# Patient Record
Sex: Female | Born: 1985 | Race: Black or African American | Hispanic: No | Marital: Single | Smoking: Never smoker
Health system: Southern US, Community
[De-identification: ages and names within clinical notes are randomized; demographics above are authoritative.]

## PROBLEM LIST (undated history)

## (undated) DIAGNOSIS — Z9889 Other specified postprocedural states: Secondary | ICD-10-CM

## (undated) DIAGNOSIS — J9383 Other pneumothorax: Secondary | ICD-10-CM

## (undated) DIAGNOSIS — R011 Cardiac murmur, unspecified: Secondary | ICD-10-CM

## (undated) DIAGNOSIS — N809 Endometriosis, unspecified: Secondary | ICD-10-CM

## (undated) DIAGNOSIS — R0602 Shortness of breath: Secondary | ICD-10-CM

## (undated) DIAGNOSIS — D649 Anemia, unspecified: Secondary | ICD-10-CM

## (undated) DIAGNOSIS — R112 Nausea with vomiting, unspecified: Secondary | ICD-10-CM

## (undated) DIAGNOSIS — R51 Headache: Secondary | ICD-10-CM

## (undated) HISTORY — DX: Other pneumothorax: J93.83

---
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
## 2010-11-02 DIAGNOSIS — R0602 Shortness of breath: Secondary | ICD-10-CM

## 2010-11-02 HISTORY — DX: Shortness of breath: R06.02

## 2011-08-25 ENCOUNTER — Emergency Department (HOSPITAL_COMMUNITY): Payer: PRIVATE HEALTH INSURANCE

## 2011-08-25 ENCOUNTER — Inpatient Hospital Stay (HOSPITAL_COMMUNITY)
Admission: EM | Admit: 2011-08-25 | Discharge: 2011-08-27 | DRG: 201 | Disposition: A | Discharge status: Home / Self Care | Payer: PRIVATE HEALTH INSURANCE | Attending: Pulmonary Disease | Admitting: Pulmonary Disease

## 2011-08-25 DIAGNOSIS — D649 Anemia, unspecified: Secondary | ICD-10-CM

## 2011-08-25 DIAGNOSIS — R079 Chest pain, unspecified: Secondary | ICD-10-CM

## 2011-08-25 DIAGNOSIS — R0989 Other specified symptoms and signs involving the circulatory and respiratory systems: Secondary | ICD-10-CM | POA: Diagnosis present

## 2011-08-25 DIAGNOSIS — J9383 Other pneumothorax: Principal | ICD-10-CM | POA: Diagnosis present

## 2011-08-25 DIAGNOSIS — R0609 Other forms of dyspnea: Secondary | ICD-10-CM | POA: Diagnosis present

## 2011-08-25 DIAGNOSIS — J9311 Primary spontaneous pneumothorax: Secondary | ICD-10-CM

## 2011-08-25 LAB — DIFFERENTIAL
Basophils Absolute: 0 10*3/uL (ref 0.0–0.1)
Basophils Relative: 1 % (ref 0–1)
Eosinophils Absolute: 0.2 10*3/uL (ref 0.0–0.7)
Eosinophils Relative: 4 % (ref 0–5)
Lymphocytes Relative: 46 % (ref 12–46)
Lymphs Abs: 2.9 10*3/uL (ref 0.7–4.0)
Monocytes Absolute: 0.5 10*3/uL (ref 0.1–1.0)
Monocytes Relative: 8 % (ref 3–12)
Neutro Abs: 2.7 10*3/uL (ref 1.7–7.7)
Neutrophils Relative %: 42 % — ABNORMAL LOW (ref 43–77)

## 2011-08-25 LAB — CBC
HCT: 40.6 % (ref 36.0–46.0)
Hemoglobin: 13.7 g/dL (ref 12.0–15.0)
MCH: 26.6 pg (ref 26.0–34.0)
MCHC: 33.7 g/dL (ref 30.0–36.0)
MCV: 78.7 fL (ref 78.0–100.0)
Platelets: 322 10*3/uL (ref 150–400)
RBC: 5.16 MIL/uL — ABNORMAL HIGH (ref 3.87–5.11)
RDW: 13 % (ref 11.5–15.5)
WBC: 6.4 10*3/uL (ref 4.0–10.5)

## 2011-08-25 LAB — URINE MICROSCOPIC-ADD ON

## 2011-08-25 LAB — URINALYSIS, ROUTINE W REFLEX MICROSCOPIC
Bilirubin Urine: NEGATIVE
Glucose, UA: NEGATIVE mg/dL
Nitrite: NEGATIVE
Specific Gravity, Urine: 1.035 — ABNORMAL HIGH (ref 1.005–1.030)
Urobilinogen, UA: 1 mg/dL (ref 0.0–1.0)

## 2011-08-26 ENCOUNTER — Emergency Department (HOSPITAL_COMMUNITY): Payer: PRIVATE HEALTH INSURANCE

## 2011-08-26 ENCOUNTER — Inpatient Hospital Stay (HOSPITAL_COMMUNITY): Payer: PRIVATE HEALTH INSURANCE

## 2011-08-26 LAB — CBC
HCT: 36.8 % (ref 36.0–46.0)
Hemoglobin: 12.3 g/dL (ref 12.0–15.0)
MCH: 26.1 pg (ref 26.0–34.0)
Platelets: 321 10*3/uL (ref 150–400)
WBC: 6.1 10*3/uL (ref 4.0–10.5)

## 2011-08-26 LAB — COMPREHENSIVE METABOLIC PANEL
ALT: 16 U/L (ref 0–35)
AST: 21 U/L (ref 0–37)
Albumin: 4.1 g/dL (ref 3.5–5.2)
Alkaline Phosphatase: 36 U/L — ABNORMAL LOW (ref 39–117)
BUN: 8 mg/dL (ref 6–23)
CO2: 24 mEq/L (ref 19–32)
Calcium: 10.2 mg/dL (ref 8.4–10.5)
Chloride: 105 mEq/L (ref 96–112)
Creatinine, Ser: 0.88 mg/dL (ref 0.50–1.10)
GFR calc Af Amer: >90 mL/min (ref >90)
GFR calc non Af Amer: >90 mL/min (ref >90)
Glucose, Bld: 88 mg/dL (ref 70–99)
Potassium: 3.6 mEq/L (ref 3.5–5.1)
Sodium: 141 mEq/L (ref 135–145)
Total Bilirubin: 0.1 mg/dL — ABNORMAL LOW (ref 0.3–1.2)
Total Protein: 8.1 g/dL (ref 6.0–8.3)

## 2011-08-26 LAB — RAPID URINE DRUG SCREEN, HOSP PERFORMED
Amphetamines: NOT DETECTED
Benzodiazepines: NOT DETECTED
Opiates: NOT DETECTED

## 2011-08-26 LAB — DIFFERENTIAL
Basophils Absolute: 0 10*3/uL (ref 0.0–0.1)
Basophils Relative: 1 % (ref 0–1)
Lymphocytes Relative: 56 % — ABNORMAL HIGH (ref 12–46)
Neutrophils Relative %: 33 % — ABNORMAL LOW (ref 43–77)

## 2011-08-26 LAB — BASIC METABOLIC PANEL
BUN: 8 mg/dL (ref 6–23)
CO2: 24 mEq/L (ref 19–32)
Chloride: 104 mEq/L (ref 96–112)
Creatinine, Ser: 0.74 mg/dL (ref 0.50–1.10)
GFR calc Af Amer: >90 mL/min (ref >90)
GFR calc non Af Amer: >90 mL/min (ref >90)
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
## 2011-08-27 ENCOUNTER — Inpatient Hospital Stay (HOSPITAL_COMMUNITY): Payer: PRIVATE HEALTH INSURANCE

## 2011-08-27 ENCOUNTER — Encounter: Payer: Self-pay | Admitting: Internal Medicine

## 2011-08-27 DIAGNOSIS — D649 Anemia, unspecified: Secondary | ICD-10-CM

## 2011-08-27 DIAGNOSIS — J9311 Primary spontaneous pneumothorax: Secondary | ICD-10-CM

## 2011-08-27 DIAGNOSIS — R079 Chest pain, unspecified: Secondary | ICD-10-CM

## 2011-08-27 NOTE — Discharge Summary (Cosign Needed)
NAMEMarland Kitchen  CATERA, HANKINS    ACCOUNT NO.:  [id number]  MEDICAL RECORD NO.:  [id number]  LOCATION:  1540                         FACILITY:  [facility]  PHYSICIAN:  Charlaine Dalton. Wert, MD, FCCPDATE OF BIRTH:  29-May-1986  DATE OF ADMISSION:  08/25/2011 DATE OF DISCHARGE:  08/27/2011                              DISCHARGE SUMMARY   ADDENDUM  After the previous discharge summary was completed, nursing got the patient up to ambulate during which time, she became more short of breath and had increased chest pain with rest.  The patient improved, however, she did continue to have significant shortness of breath with any activity.  The patient's discharge was canceled at that time, and the patient was admitted for further inpatient observation x24 hours.  Today, August 27, 2011, the patient is much improved, has ambulated and showered without significant shortness of breath or chest pain, and a spontaneous right pneumothorax is slightly improved on this morning's chest x-ray.  The patient will be discharged per previous discharge summary and will keep her outpatient follow up with Dr. Sherene Sires tomorrow morning on August 28, 2011 at 8:45 a.m. with a chest x-ray for further monitoring of spontaneous right pneumothorax.  The patient has been instructed to call the office and/or return to the emergency room if she experiences any worsening shortness of breath or chest pain.  Please see previous discharge summary for discharge medications.     Dirk Dress, NP   ______________________________ Charlaine Dalton. Sherene Sires, MD, [facility]    KW/MEDQ  D:  08/27/2011  T:  08/27/2011  Job:  [id number]

## 2011-08-28 ENCOUNTER — Ambulatory Visit (INDEPENDENT_AMBULATORY_CARE_PROVIDER_SITE_OTHER)
Admission: RE | Admit: 2011-08-28 | Discharge: 2011-08-28 | Disposition: A | Discharge status: Home / Self Care | Payer: PRIVATE HEALTH INSURANCE | Source: Ambulatory Visit | Attending: Internal Medicine | Admitting: Internal Medicine

## 2011-08-28 ENCOUNTER — Encounter: Payer: Self-pay | Admitting: *Deleted

## 2011-08-28 ENCOUNTER — Encounter: Payer: Self-pay | Admitting: Internal Medicine

## 2011-08-28 ENCOUNTER — Ambulatory Visit (INDEPENDENT_AMBULATORY_CARE_PROVIDER_SITE_OTHER): Payer: PRIVATE HEALTH INSURANCE | Admitting: Internal Medicine

## 2011-08-28 VITALS — BP 112/60 | HR 84 | Temp 98.4°F | Ht 63.5 in | Wt 121.8 lb

## 2011-08-28 DIAGNOSIS — J9383 Other pneumothorax: Secondary | ICD-10-CM

## 2011-08-28 DIAGNOSIS — J939 Pneumothorax, unspecified: Secondary | ICD-10-CM

## 2011-08-28 NOTE — Progress Notes (Signed)
Subjective:     Patient ID: Claudia Snyder, female   DOB: [birth year], 25 y.o.   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  Minimal mucoid sputum.   Sleeping ok without nocturnal  or early am exacerbation  of respiratory  c/o's or need for noct saba. Also denies any obvious fluctuation of symptoms with weather or environmental changes or other aggravating or alleviating factors except as outlined above      Fm Hx Pneumothorax/ mother Asthma/ sister    Review of Systems     Objective:   Physical Exam    amb bf nad   Wt 121 08/28/2011  HEENT: nl dentition, turbinates, and orophanx. Nl external ear canals without cough reflex   NECK :  without JVD/Nodes/TM/ nl carotid upstrokes bilaterally   LUNGS: no acc muscle use, minimal decrease bs on R, no wheeze  CV:  RRR  no s3 or murmur or increase in P2, no edema   ABD:  soft and nontender with nl excursion in the supine position. No bruits or organomegaly, bowel sounds nl  MS:  warm without deformities, calf tenderness, cyanosis or clubbing  SKIN: warm and dry without lesions    NEURO:  alert, approp, no deficits   CXR  08/28/2011 :  Persistent right apical pneumothorax with no interval change in size since the previous exam.    Assessment:         Plan:

## 2011-08-28 NOTE — Progress Notes (Signed)
Quick Note:  Spoke with pt and notified of results per Dr. Wert. Pt verbalized understanding and denied any questions.  ______ 

## 2011-08-28 NOTE — Patient Instructions (Addendum)
Please remember to go to the x-ray department downstairs for your tests - we will call you with the results when then are available.   Please schedule a follow up office visit in 1 weeks, sooner if needed with cxr on return  > if pain or short of breath get a lot worse go to [place].

## 2011-08-28 NOTE — Progress Notes (Deleted)
dfse

## 2011-08-29 ENCOUNTER — Encounter: Payer: Self-pay | Admitting: Internal Medicine

## 2011-08-29 NOTE — Assessment & Plan Note (Signed)
Discussed etiology and management options  See instructions for specific recommendations which were reviewed directly with the patient who was given a copy with highlighter outlining the key components.

## 2011-09-03 ENCOUNTER — Other Ambulatory Visit: Payer: Self-pay | Admitting: *Deleted

## 2011-09-03 DIAGNOSIS — J939 Pneumothorax, unspecified: Secondary | ICD-10-CM

## 2011-09-04 ENCOUNTER — Encounter: Payer: Self-pay | Admitting: *Deleted

## 2011-09-04 ENCOUNTER — Ambulatory Visit (INDEPENDENT_AMBULATORY_CARE_PROVIDER_SITE_OTHER)
Admission: RE | Admit: 2011-09-04 | Discharge: 2011-09-04 | Disposition: A | Discharge status: Home / Self Care | Payer: PRIVATE HEALTH INSURANCE | Source: Ambulatory Visit | Attending: Internal Medicine | Admitting: Internal Medicine

## 2011-09-04 ENCOUNTER — Ambulatory Visit (INDEPENDENT_AMBULATORY_CARE_PROVIDER_SITE_OTHER): Payer: PRIVATE HEALTH INSURANCE | Admitting: Internal Medicine

## 2011-09-04 ENCOUNTER — Encounter: Payer: Self-pay | Admitting: Internal Medicine

## 2011-09-04 VITALS — BP 92/60 | HR 73 | Temp 99.0°F | Ht 63.5 in | Wt 121.2 lb

## 2011-09-04 DIAGNOSIS — J939 Pneumothorax, unspecified: Secondary | ICD-10-CM

## 2011-09-04 DIAGNOSIS — J9383 Other pneumothorax: Secondary | ICD-10-CM

## 2011-09-04 NOTE — Progress Notes (Signed)
Subjective:     Patient ID: Claudia Snyder, female   DOB: 02-02-1986, 25 y.o.   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  Minimal mucoid sputum.  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 Not needing narcs for cp any more at all   Sleeping ok without nocturnal  or early am exacerbation  of respiratory  c/o's or need for noct saba. Also denies any obvious fluctuation of symptoms with weather or environmental changes or other aggravating or alleviating factors except as outlined above      Fm Hx Pneumothorax/ mother Asthma/ sister    Review of Systems     Objective:   Physical Exam    amb bf nad   Wt 121 08/28/2011 > 09/04/2011  121   HEENT: nl dentition, turbinates, and orophanx. Nl external ear canals without cough reflex   NECK :  without JVD/Nodes/TM/ nl carotid upstrokes bilaterally   LUNGS: no acc muscle use, minimal decrease bs on R, no wheeze  CV:  RRR  no s3 or murmur or increase in P2, no edema   ABD:  soft and nontender with nl excursion in the supine position. No bruits or organomegaly, bowel sounds nl  MS:  warm without deformities, calf tenderness, cyanosis or clubbing     CXR  09/04/2011 :  Slt improvement R apical pneumothorax    Assessment:         Plan:

## 2011-09-04 NOTE — Patient Instructions (Signed)
Ok to return to work, avoid heavy lifting or straining but especially all smoking  Please schedule a follow up office visit in 4 weeks, sooner if needed with cxr on return - if condition gets much worse go straight to [place]

## 2011-09-05 NOTE — Assessment & Plan Note (Signed)
Improving with conservative rx, alpha one testing still pending.  Discussed need for f/u in a month for a baseline cxr and possible surgery (vats pleurodesis) if recurs/ worsens
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
## 2011-09-10 ENCOUNTER — Encounter (HOSPITAL_COMMUNITY): Payer: Self-pay | Admitting: *Deleted

## 2011-09-10 ENCOUNTER — Ambulatory Visit (INDEPENDENT_AMBULATORY_CARE_PROVIDER_SITE_OTHER): Payer: PRIVATE HEALTH INSURANCE | Admitting: Adult Health

## 2011-09-10 ENCOUNTER — Encounter: Payer: Self-pay | Admitting: Adult Health

## 2011-09-10 ENCOUNTER — Inpatient Hospital Stay (HOSPITAL_COMMUNITY)
Admission: AD | Admit: 2011-09-10 | Discharge: 2011-09-11 | DRG: 201 | Disposition: A | Discharge status: Home / Self Care | Payer: PRIVATE HEALTH INSURANCE | Source: Ambulatory Visit | Attending: Internal Medicine | Admitting: Internal Medicine

## 2011-09-10 ENCOUNTER — Ambulatory Visit (INDEPENDENT_AMBULATORY_CARE_PROVIDER_SITE_OTHER)
Admission: RE | Admit: 2011-09-10 | Discharge: 2011-09-10 | Disposition: A | Discharge status: Home / Self Care | Payer: PRIVATE HEALTH INSURANCE | Source: Ambulatory Visit | Attending: Adult Health | Admitting: Adult Health

## 2011-09-10 DIAGNOSIS — J939 Pneumothorax, unspecified: Secondary | ICD-10-CM

## 2011-09-10 DIAGNOSIS — J9383 Other pneumothorax: Secondary | ICD-10-CM

## 2011-09-10 DIAGNOSIS — J930 Spontaneous tension pneumothorax: Secondary | ICD-10-CM

## 2011-09-10 DIAGNOSIS — Z87891 Personal history of nicotine dependence: Secondary | ICD-10-CM

## 2011-09-10 DIAGNOSIS — R079 Chest pain, unspecified: Secondary | ICD-10-CM

## 2011-09-10 HISTORY — DX: Cardiac murmur, unspecified: R01.1

## 2011-09-10 HISTORY — DX: Headache: R51

## 2011-09-10 HISTORY — DX: Shortness of breath: R06.02

## 2011-09-10 HISTORY — DX: Anemia, unspecified: D64.9

## 2011-09-10 LAB — COMPREHENSIVE METABOLIC PANEL
Albumin: 3.7 g/dL (ref 3.5–5.2)
Alkaline Phosphatase: 29 U/L — ABNORMAL LOW (ref 39–117)
BUN: 7 mg/dL (ref 6–23)
Calcium: 10.1 mg/dL (ref 8.4–10.5)
GFR calc Af Amer: >90 mL/min (ref >90)
Glucose, Bld: 91 mg/dL (ref 70–99)
Potassium: 3.5 mEq/L (ref 3.5–5.1)
Total Protein: 7.3 g/dL (ref 6.0–8.3)

## 2011-09-10 LAB — PROTIME-INR
INR: 1 (ref 0.00–1.49)
Prothrombin Time: 13.4 seconds (ref 11.6–15.2)

## 2011-09-10 LAB — CBC
HCT: 37.5 % (ref 36.0–46.0)
MCH: 26.4 pg (ref 26.0–34.0)
MCV: 78 fL (ref 78.0–100.0)
Platelets: 304 10*3/uL (ref 150–400)
RDW: 12.9 % (ref 11.5–15.5)

## 2011-09-10 MED ORDER — SODIUM CHLORIDE 0.9 % IJ SOLN
3.0000 mL | Freq: Two times a day (BID) | INTRAMUSCULAR | Status: DC
Start: 2011-09-10 — End: 2011-09-10

## 2011-09-10 MED ORDER — SODIUM CHLORIDE 0.9 % IV SOLN: 250.0000 mL | INTRAVENOUS | Status: DC

## 2011-09-10 MED ORDER — SODIUM CHLORIDE 0.9 % IJ SOLN: 3.0000 mL | INTRAMUSCULAR | Status: DC | PRN

## 2011-09-10 NOTE — Consults (Signed)
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  The pain waxed and waned then became associated with mild shortness of breath, causing her to present to [facility] on 08/26/2011.  CXR at that time revealed a small right apical PTX.  She was observed and later d/c'd on bedrest for a week.  She has been followed with serial CXR's since then with stable radiographs.  She started to return to work over the past few days and developed recurrent pain for which she presented to the office.  Repeat CXR reveals persistent small right PTX.  She was admitted to the hospital and cardiothoracic surgery consult was requested.   Past Medical History  Diagnosis Date  . Spontaneous pneumothorax     No past surgical history on file.  History  Smoking status  . Former Smoker  . Quit date: 08/02/2011  Smokeless tobacco  . Never Used  Comment: Pt states used to go to the "hookah bars" ans smoke flavored tobacco    History  Alcohol Use  . Yes    occ    History   Social History  . Marital Status: Single    Spouse Name: N/A    Number of Children: N/A  . 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 Smoking status: Former Smoker    Quit date: 08/02/2011  . Smokeless tobacco: Never Used   Comment: Pt states used to go to the "hookah bars" ans smoke flavored tobacco  . Alcohol Use: Yes     occ  . Drug Use: Yes  . Sexually Active: Not on file   Other Topics Concern  . Not on file    Social History Narrative  . No narrative on file    No Known Allergies  No current facility-administered medications for this encounter.    Prescriptions prior to admission  Medication Sig Dispense Refill  . adapalene (DIFFERIN) 0.1 % cream Apply 1 application topically at bedtime.        Marland Kitchen doxycycline (VIBRAMYCIN) 100 MG capsule Take 100 mg by mouth 2 (two) times daily.        . Multiple Vitamins-Minerals (MULTIVITAMINS THER. W/MINERALS) TABS Take 1 tablet by mouth daily.        . Norethin Ace-Eth Estrad-FE (LOESTRIN FE 1/20 PO) Take 1 tablet by mouth daily.        Marland Kitchen oxyCODONE-acetaminophen (PERCOCET) 5-325 MG per tablet Take 1 tablet by mouth every 6 (six) hours as needed. For cramps      . naproxen (NAPROSYN) 500 MG tablet Take 500 mg by mouth as needed. For cramps        Family History  Problem Relation Age of Onset  . Asthma Sister   . Cancer Maternal Grandmother     ?  type     Review of Systems:    Otherwise unremarkable  She has no current shortness of breath  She has mild pain in right shoulder and back, worse with cough or deep inspiration  Appetite is normal  No fevers  Normal bowel function  Not currently in nor near her expected menstrual cycle     Physical Exam:  Well appearing female  Breathing comfortably on room air  HEENT normal  Neck supple  Breath sounds clear with fairly symmetrical exam, good breath sounds on right  CV RRR  Abdomen soft  Extremities warm   Diagnostic Studies & Laboratory data:     Recent Radiology Findings:   Dg Chest 2 View  09/10/2011  *RADIOLOGY REPORT*  Clinical Data: Pneumothorax, follow-up exam, persistent chest pain  CHEST - 2 VIEW  Comparison: 09/04/2011  Findings: Slight enlargement and the right apical pneumothorax. Pleural separation roughly measures 2.8 cm.  Previously this measured 1.5 cm.  No effusions.  Normal heart size and vascularity. Trachea is midline.  No focal pneumonia, airspace disease  or consolidation.  IMPRESSION: Slight increase in right apical pneumothorax compared to 09/04/2011.  Original Report Authenticated By: Judie Petit. Ruel Favors, M.D.      Recent Lab Findings: Lab Results  Component Value Date   WBC 6.1 08/26/2011   HGB 12.3 08/26/2011   HCT 36.8 08/26/2011   PLT 321 08/26/2011   GLUCOSE 86 08/26/2011   ALT 16 08/25/2011   AST 21 08/25/2011   NA 139 08/26/2011   K 3.5 08/26/2011   CL 104 08/26/2011   CREATININE 0.74 08/26/2011   BUN 8 08/26/2011   CO2 24 08/26/2011   INR 1.01 08/26/2011      Assessment / Plan:      First time occurrence right spontaneous pneumothorax.  The patient has continued intermittent mild pain without any shortness of breath.  The degree of right lung collapse is quite small and has remained remarkably stable for the past 2 weeks.  The amount of difference in the size of pneumothorax between films is extremely subtle if present at all.  Options include:  Continued observation with serial x-rays  Chest tube placement  VATS for bleb resection, pleurodesis  I reviewed these options at length with the patient.  She is inclined to wish to stick with conservative management with observation.  Given her stability over time, the lack of respiratory compromise, the small size of the pneumothorax, and the fact that this is a first time occurrence, I think this is reasonable.  Will order repeat CXR in am.  If stable I would be happy to arrange for further outpatient follow-up.

## 2011-09-10 NOTE — H&P (Signed)
Hospital Admission Note Date: 09/10/2011  Patient name: Claudia Snyder Medical record number: [id number] Date of birth: [birth year] Age: 25 y.o. 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 Seen again on 09/04/2011 with CXR w/ sl decrease in right PTX , Alpha one sent >>She has an extensive family hx of recurrent PTX in mom and aunt.   Presents today for an acute office visit complaining of  having episode yesterday  where had severe sharp CP last night while working in , lasted a few hours and was relieved somewhat with taking percocet. She states that she also felt that she was unable to take in a good, deep breath. CXR today shows increased right sided PTX.   She will require admission for further evaluation .        Allergies: Review of patient's allergies indicates no known allergies.  Past Medical History  Diagnosis Date  . Spontaneous pneumothorax     No past surgical history on file.  Family History  Problem Relation Age of Onset  . Asthma Sister   . Cancer Maternal Grandmother     ? type    History   Social History  . Marital Status: Single    Spouse Name: N/A    Number of Children: N/A  . 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 Smoking status: Former Smoker    Quit date: 08/02/2011  . Smokeless tobacco: Never Used   Comment: Pt states used to go to the "hookah bars" ans smoke flavored tobacco  . Alcohol Use: Yes     occ  . Drug Use: Yes  . Sexually Active: Not on file   Other Topics Concern  . Not on file   Social History Narrative  . No narrative on  file    Medications: Prior to Admission medications   Medication Sig Start Date End Date Taking? Authorizing Provider  doxycycline (VIBRAMYCIN) 100 MG capsule Take 100 mg by mouth 2 (two) times daily.      Historical Provider, MD  naproxen (NAPROSYN) 500 MG tablet Take 500 mg by mouth as needed.      Historical Provider, MD  Norethin Ace-Eth Estrad-FE (LOESTRIN FE 1/20 PO) Take 1 tablet by mouth daily.      Historical Provider, MD  oxyCODONE-acetaminophen (PERCOCET) 5-325 MG per tablet Take 1 tablet by mouth every 6 (six) hours as needed.      Historical Provider, MD     Review of Systems:   Constitutional:   No  weight loss, night sweats,  Fevers, chills, fatigue, lassitude. HEENT:   No headaches,  Difficulty swallowing,  Tooth/dental problems,  Sore throat,                No sneezing, itching, ear ache, nasal congestion, post nasal drip,   CV:  ++right sided  chest pain,  NO  Orthopnea, PND, swelling in lower extremities, anasarca, dizziness, palpitations  GI  No heartburn, indigestion, abdominal pain, nausea, vomiting, diarrhea, change in bowel habits, loss of appetite  Resp: No shortness of breath with exertion or at rest.  No excess mucus, no productive cough,  No non-productive cough,  No coughing up of blood.  No change in color of mucus.  No wheezing.  No chest wall deformity  Skin: no rash or lesions.  GU: no dysuria, change in color of urine, no urgency or frequency.  No flank pain.  MS:  No joint pain or swelling.  No decreased range of motion.  No back pain.  Psych:  No change in mood or affect. No depression or anxiety.  No memory loss.    Physical Exam: No data found. ]VSS.  No intake or output data in the 24 hours ending 09/10/11 1626  Gen: Pleasant, well-nourished, in no distress,  normal affect  ENT: No lesions,  mouth clear,  oropharynx clear, no postnasal drip  Neck: No JVD, no TMG, no carotid bruits  Lungs: No use of accessory muscles, no dullness  to percussion, clear without rales or rhonchi  Cardiovascular: RRR, heart sounds normal, no murmur or gallops, no peripheral edema  Abdomen: soft and NT, no HSM,  BS normal  Musculoskeletal: No deformities, no cyanosis or clubbing  Neuro: alert, non focal  Skin: Warm, no lesions or rashes   VITALS: See office note  LAB RESULTS pending   Radiology: Dg Chest 2 View  09/10/2011  *RADIOLOGY REPORT*  Clinical Data: Pneumothorax, follow-up exam, persistent chest pain  CHEST - 2 VIEW  Comparison: 09/04/2011  Findings: Slight enlargement and the right apical pneumothorax. Pleural separation roughly measures 2.8 cm.  Previously this measured 1.5 cm.  No effusions.  Normal heart size and vascularity. Trachea is midline.  No focal pneumonia, airspace disease or consolidation.  IMPRESSION: Slight increase in right apical pneumothorax compared to 09/04/2011.  Original Report Authenticated By: Judie Petit. Ruel Favors, M.D.    Assessment & Plan   Principal Problem:  *Pneumothorax on right  Will admit to [facility] hospital for further evaluation and treatment .  Begin NRB at 100%.  Consult thoracic surgeon. (done)  Pain meds As needed   Alpha 1 pending.

## 2011-09-10 NOTE — Patient Instructions (Signed)
Go straight to hospital admitting at [facility].

## 2011-09-10 NOTE — Assessment & Plan Note (Signed)
Admit.  See admit note.

## 2011-09-10 NOTE — Progress Notes (Signed)
eLink Physician-Brief Progress Note Patient Name: Avery Eustice DOB: 06/01/1986 MRN: [id number]  Date of Service  09/10/2011   HPI/Events of Note   Patient arrived in floor. Housekeeping orders done due to epic issues  eICU Interventions  1. piv ordered to saline lock 2 kvo dc'ed 3. Diet ordered   Intervention Category Intermediate Interventions: Communication with other healthcare providers and/or family  Maryum Batterson 09/10/2011, 8:21 PM

## 2011-09-10 NOTE — Progress Notes (Signed)
Subjective:     Patient ID: Claudia Snyder, female   DOB: 1986/08/01, 25 y.o.   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  Minimal mucoid sputum.  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 Not needing narcs for cp any more at all >>CXR w/ sl decrease in right PTX , Alpha one sent >>   09/10/2011 Acute OV  Pt c/o having episode where had severe sharp CP last night while working in , lasted a few hours and was relieved somewhat with taking percocet. She states that she also felt that she was unable to take in a good, deep breath.  Does not smoke "hookah" anymore. No cigs. No illegal drug use. Previous ER drug screen neg.  CXR today      Fm Hx Pneumothorax/ mother Asthma/ sister    Review of Systems Constitutional:   No  weight loss, night sweats,  Fevers, chills, fatigue, or  lassitude.  HEENT:   No headaches,  Difficulty swallowing,  Tooth/dental problems, or  Sore throat,                No sneezing, itching, ear ache, nasal congestion, post nasal drip,   CV:  ++right sided  chest pain,   NO Orthopnea, PND, swelling in lower extremities, anasarca, dizziness, palpitations, syncope.   GI  No heartburn, indigestion, abdominal pain, nausea, vomiting, diarrhea, change in bowel habits, loss of appetite, bloody stools.   Resp: No shortness of breath with exertion or at rest.  No excess mucus, no productive cough,  No non-productive cough,  No coughing up of blood.  No change in color of mucus.  No wheezing.   No chest wall deformity  Skin: no rash or lesions.  GU: no dysuria, change in color of urine, no urgency or frequency.  No flank pain, no hematuria   MS:  No joint pain or swelling.  No decreased range of motion.  No back pain.  Psych:  No change in mood or affect. No depression or anxiety.  No memory loss.          Objective:   Physical Exam    amb bf nad   Wt 121 08/28/2011 > 09/04/2011  121 >>124 09/10/2011   HEENT: nl dentition, turbinates, and orophanx. Nl external ear canals without cough reflex   NECK :  without JVD/Nodes/TM/ nl carotid upstrokes bilaterally   LUNGS: no acc muscle use, minimal decrease bs on R, no wheeze  CV:  RRR  no s3 or murmur or increase in P2, no edema   ABD:  soft and nontender with nl excursion in the supine position. No bruits or organomegaly, bowel sounds nl  MS:  warm without deformities, calf tenderness, cyanosis or clubbing     CXR  09/04/2011 :  Slt improvement R apical pneumothorax  CXR 09/10/2011  >>Slight increase in right apical pneumothorax compared to  09/04/2011.    Assessment:  Plan:

## 2011-09-11 ENCOUNTER — Inpatient Hospital Stay (HOSPITAL_COMMUNITY): Payer: PRIVATE HEALTH INSURANCE

## 2011-09-11 ENCOUNTER — Encounter: Payer: Self-pay | Admitting: Pulmonary Disease

## 2011-09-11 LAB — CBC
HCT: 34 % — ABNORMAL LOW (ref 36.0–46.0)
Hemoglobin: 11.5 g/dL — ABNORMAL LOW (ref 12.0–15.0)
MCH: 26.3 pg (ref 26.0–34.0)
MCHC: 33.8 g/dL (ref 30.0–36.0)
RDW: 13.1 % (ref 11.5–15.5)

## 2011-09-11 LAB — BASIC METABOLIC PANEL
CO2: 21 mEq/L (ref 19–32)
Calcium: 9.2 mg/dL (ref 8.4–10.5)
Chloride: 106 mEq/L (ref 96–112)
Creatinine, Ser: 0.7 mg/dL (ref 0.50–1.10)
Glucose, Bld: 85 mg/dL (ref 70–99)

## 2011-09-11 MED ORDER — ACETAMINOPHEN 80 MG PO CHEW
320.0000 mg | CHEWABLE_TABLET | Freq: Three times a day (TID) | ORAL | Status: DC | PRN
  Filled 2011-09-11: qty 4

## 2011-09-11 MED ORDER — OXYCODONE-ACETAMINOPHEN 5-325 MG PO TABS: 1.0000 | ORAL_TABLET | Freq: Four times a day (QID) | ORAL | Status: DC | PRN

## 2011-09-11 MED ORDER — ACETAMINOPHEN 325 MG PO TABS
325.0000 mg | ORAL_TABLET | Freq: Three times a day (TID) | ORAL | Status: DC | PRN
  Administered 2011-09-11: 650 mg via ORAL
  Filled 2011-09-11: qty 2

## 2011-09-11 NOTE — Progress Notes (Signed)
09/10/11 1915 Pt direct admit to floor to monitor pneumothorax.  Pt has had pneumothorax for 3 weeks and had returned to work.  Last night at work pt felt a sharp pain and contacted Sherene Sires, MD.  Pt then admitted for observation today.  Pt is alert & oriented x 4, ambulatory, skin wnl and full code.  Pt viewed safety video.  Pt is non telemetry.

## 2011-09-11 NOTE — Progress Notes (Signed)
Reviewed discharge instruction with patient. Patient verbalized understanding. No question or concerns verbalized.  IV removed. Skin intact, will escort patient out per Dr Lodema Hong ok for patient to driver herself.

## 2011-09-11 NOTE — Discharge Summary (Signed)
HPI:  Pt has history of spontaneous pneumothorax first diagnosed 10/23 and treated conservatively. There has been no radiographic improvement since diagnosis was made. She presented to [employer] office with increased R pleuritic CP. Chest Xray revealed no significant change in the size of the PTX. Exam revealed no significant findings. She was admitted for observation.  Hosp Course: There were no complications during her brief hospitalization. She was seen in consultation by Dr Cornelius Moras of [facility]. He discussed options and mgmt and they agreed to continue with conservative strategy. She now desires D/C home and her pain is much improved.  Condition on Discharge: Stable

## 2011-09-14 ENCOUNTER — Ambulatory Visit (INDEPENDENT_AMBULATORY_CARE_PROVIDER_SITE_OTHER): Payer: PRIVATE HEALTH INSURANCE | Admitting: Thoracic Surgery (Cardiothoracic Vascular Surgery)

## 2011-09-14 ENCOUNTER — Ambulatory Visit
Admission: RE | Admit: 2011-09-14 | Discharge: 2011-09-14 | Disposition: A | Discharge status: Home / Self Care | Payer: PRIVATE HEALTH INSURANCE | Source: Ambulatory Visit | Attending: Thoracic Surgery (Cardiothoracic Vascular Surgery) | Admitting: Thoracic Surgery (Cardiothoracic Vascular Surgery)

## 2011-09-14 ENCOUNTER — Encounter: Payer: Self-pay | Admitting: Thoracic Surgery (Cardiothoracic Vascular Surgery)

## 2011-09-14 ENCOUNTER — Other Ambulatory Visit: Payer: Self-pay | Admitting: Thoracic Surgery (Cardiothoracic Vascular Surgery)

## 2011-09-14 VITALS — BP 127/68 | HR 63 | Resp 16 | Ht 63.5 in | Wt 120.0 lb

## 2011-09-14 DIAGNOSIS — J930 Spontaneous tension pneumothorax: Secondary | ICD-10-CM

## 2011-09-14 DIAGNOSIS — J9383 Other pneumothorax: Secondary | ICD-10-CM

## 2011-09-14 DIAGNOSIS — J939 Pneumothorax, unspecified: Secondary | ICD-10-CM

## 2011-09-14 NOTE — Progress Notes (Signed)
PCP is Provider Not In System Referring Provider is Sandrea Hughs, MD  Chief Complaint  Patient presents with  . Spontaneous Pneumothorax    Hospital f/u with CXR , spontaneous right pneumothorax    HPI: Patient returns for followup of a small right apical spontaneous pneumothorax. She was seen in consultation on November 8 and a full consultation note was performed at that time. Her pneumothorax remained stable on followup x-ray November 9 and she was discharged from the hospital. She returns to the office today for further followup. She reports feeling better. She's not having any chest pain. She denies any shortness of breath. She has not been doing any sort of strenuous physical activity, and she has been primarily working on her schoolwork. She has no other complaints.  Past Medical History  Diagnosis Date  . Spontaneous pneumothorax   . Heart murmur   . Shortness of breath   . Headache   . Anemia     No past surgical history on file.  Family History  Problem Relation Age of Onset  . Asthma Sister   . Cancer Maternal Grandmother     ? type    Social History History  Substance Use Topics  . Smoking status: Never Smoker   . Smokeless tobacco: Never Used   Comment: Pt states used to go to the "hookah bars" ans smoke flavored tobacco  . Alcohol Use: Yes     occ;1 or 2 x month    Current Outpatient Prescriptions  Medication Sig Dispense Refill  . adapalene (DIFFERIN) 0.1 % cream Apply 1 application topically at bedtime.        Marland Kitchen doxycycline (VIBRAMYCIN) 100 MG capsule Take 100 mg by mouth 2 (two) times daily.        . Multiple Vitamin (MULTIVITAMIN) tablet Take 1 tablet by mouth daily.        . naproxen (NAPROSYN) 500 MG tablet Take 500 mg by mouth as needed. For cramps      . Norethin Ace-Eth Estrad-FE (LOESTRIN FE 1/20 PO) Take 1 tablet by mouth daily.        Marland Kitchen oxyCODONE-acetaminophen (PERCOCET) 5-325 MG per tablet Take 1 tablet by mouth every 6 (six) hours as needed  for pain. For cramps  30 tablet  0    No Known Allergies  Review of Systems: No shortness of breath. No chest pain. Remainder not contributory.  BP 127/68  Pulse 63  Resp 16  Ht 5' 3.5" (1.613 m)  Wt 120 lb (54.432 kg)  BMI 20.92 kg/m2  SpO2 100%  LMP 08/06/2011 Physical Exam: Well-appearing female breathing comfortably on room air. Slightly diminished breath sounds at the right lung apex when auscultated anteriorly. Otherwise breath sounds are symmetrical and clear. Cardiovascular exam is normal. Remainder of physical exam noncontributory.  Diagnostic Tests: Chest x-ray today demonstrates slight decrease size of the small right apical pneumothorax.  Impression: Small residual right spontaneous pneumothorax. The patient is doing well with conservative management and hopes to avoid any sort of surgical intervention.  Plan: We again discussed options as they were outlined in my previous consultation note last week. I've advised the patient to avoid any sort of strenuous physical activity or heavy lifting. We will plan to see her back in 2 weeks with a repeat chest x-ray.

## 2011-09-14 NOTE — Patient Instructions (Signed)
No strenuous physical activity. No heavy lifting.

## 2011-09-17 ENCOUNTER — Encounter: Payer: Self-pay | Admitting: Internal Medicine

## 2011-09-18 ENCOUNTER — Telehealth: Payer: Self-pay | Admitting: Internal Medicine

## 2011-09-18 NOTE — Telephone Encounter (Signed)
Per MW- Alpha 1 was neg. ATC pt and NA, unable to leave msg, [facility]

## 2011-09-22 ENCOUNTER — Other Ambulatory Visit: Payer: Self-pay | Admitting: Thoracic Surgery (Cardiothoracic Vascular Surgery)

## 2011-09-22 DIAGNOSIS — J9383 Other pneumothorax: Secondary | ICD-10-CM

## 2011-09-28 ENCOUNTER — Ambulatory Visit: Payer: PRIVATE HEALTH INSURANCE | Admitting: Thoracic Surgery (Cardiothoracic Vascular Surgery)

## 2011-09-28 ENCOUNTER — Ambulatory Visit (INDEPENDENT_AMBULATORY_CARE_PROVIDER_SITE_OTHER): Payer: PRIVATE HEALTH INSURANCE | Admitting: Thoracic Surgery (Cardiothoracic Vascular Surgery)

## 2011-09-28 ENCOUNTER — Ambulatory Visit
Admission: RE | Admit: 2011-09-28 | Discharge: 2011-09-28 | Disposition: A | Discharge status: Home / Self Care | Payer: PRIVATE HEALTH INSURANCE | Source: Ambulatory Visit | Attending: Thoracic Surgery (Cardiothoracic Vascular Surgery) | Admitting: Thoracic Surgery (Cardiothoracic Vascular Surgery)

## 2011-09-28 ENCOUNTER — Encounter: Payer: Self-pay | Admitting: Thoracic Surgery (Cardiothoracic Vascular Surgery)

## 2011-09-28 VITALS — BP 124/73 | HR 80 | Resp 18 | Ht 63.5 in | Wt 120.0 lb

## 2011-09-28 DIAGNOSIS — J9383 Other pneumothorax: Secondary | ICD-10-CM

## 2011-09-28 DIAGNOSIS — J939 Pneumothorax, unspecified: Secondary | ICD-10-CM

## 2011-09-28 NOTE — Telephone Encounter (Signed)
Spoke with pt and notified of results per Dr. Wert. Pt verbalized understanding and denied any questions. 

## 2011-09-28 NOTE — Progress Notes (Signed)
                   [address]            [place] [address]          [phone]     CARDIOTHORACIC SURGERY OFFICE NOTE  Referring Provider is Sandrea Hughs, MD PCP is Provider Not In System   HPI:  Patient returns for follow-up of her small right spontaneous pneumothorax.  She was last seen here in the office 09/14/2011. Since then she reports feeling well. She states that he only occasionally will she still have some mild pain across her chest for the most part she is having no pain at all. She's not having any shortness of breath. She is at school going to all of her classes she has had no problems nor limitations.   Current Outpatient Prescriptions  Medication Sig Dispense Refill  . adapalene (DIFFERIN) 0.1 % cream Apply 1 application topically at bedtime.        Marland Kitchen doxycycline (VIBRAMYCIN) 100 MG capsule Take 100 mg by mouth 2 (two) times daily.        . Multiple Vitamin (MULTIVITAMIN) tablet Take 1 tablet by mouth daily.        . naproxen (NAPROSYN) 500 MG tablet Take 500 mg by mouth as needed. For cramps      . Norethin Ace-Eth Estrad-FE (LOESTRIN FE 1/20 PO) Take 1 tablet by mouth daily.        Marland Kitchen oxyCODONE-acetaminophen (PERCOCET) 5-325 MG per tablet Take 1 tablet by mouth every 6 (six) hours as needed for pain. For cramps  30 tablet  0      Physical Exam:   BP 124/73  Pulse 80  Resp 18  Ht 5' 3.5" (1.613 m)  Wt 120 lb (54.432 kg)  BMI 20.92 kg/m2  SpO2 99%  LMP 08/06/2011  On physical exam the patient has clear breath sounds. Breath sounds remains slightly diminished on the right side compared to the left. No wheezes rales or rhonchi are noted. Neurovascular exam is notable for regular rate and rhythm. The abdomen is soft nontender. Extremities warm and well-perfused.  Diagnostic Tests:  Chest x-ray performed today demonstrates persistent small right pneumothorax. Overall size of the pneumothorax may be slightly larger, but essentially remains more  or less unchanged over the past 6 weeks.  Impression:  Persistent small right spontaneous pneumothorax which at this point is essentially asymptomatic.  Plan:  I again reviewed options with the patient including continued observation versus chest tube placement versus thoracoscopic surgery for blood resection and pleura these. She does not wish to consider any sort of surgical intervention unless it is absolutely necessary. We will plan repeat chest x-ray in 3 weeks. She has been reminded to refrain from any sort of strenuous physical activity and she also has been specifically counseled that she should not get on an airplane undergone any sort of long trips.    Salvatore Decent. Cornelius Moras, MD

## 2011-10-01 ENCOUNTER — Encounter: Payer: Self-pay | Admitting: Internal Medicine

## 2011-10-02 ENCOUNTER — Ambulatory Visit: Payer: PRIVATE HEALTH INSURANCE | Admitting: Internal Medicine

## 2011-10-15 ENCOUNTER — Other Ambulatory Visit: Payer: Self-pay | Admitting: Thoracic Surgery (Cardiothoracic Vascular Surgery)

## 2011-10-15 DIAGNOSIS — J9383 Other pneumothorax: Secondary | ICD-10-CM

## 2011-10-19 ENCOUNTER — Encounter: Payer: Self-pay | Admitting: Thoracic Surgery (Cardiothoracic Vascular Surgery)

## 2011-10-19 ENCOUNTER — Ambulatory Visit
Admission: RE | Admit: 2011-10-19 | Discharge: 2011-10-19 | Disposition: A | Discharge status: Home / Self Care | Payer: PRIVATE HEALTH INSURANCE | Source: Ambulatory Visit | Attending: Thoracic Surgery (Cardiothoracic Vascular Surgery) | Admitting: Thoracic Surgery (Cardiothoracic Vascular Surgery)

## 2011-10-19 ENCOUNTER — Ambulatory Visit (INDEPENDENT_AMBULATORY_CARE_PROVIDER_SITE_OTHER): Payer: PRIVATE HEALTH INSURANCE | Admitting: Thoracic Surgery (Cardiothoracic Vascular Surgery)

## 2011-10-19 DIAGNOSIS — J9383 Other pneumothorax: Secondary | ICD-10-CM

## 2011-10-19 DIAGNOSIS — J939 Pneumothorax, unspecified: Secondary | ICD-10-CM | POA: Insufficient documentation

## 2011-10-19 NOTE — Progress Notes (Signed)
                   [address]            [place] [address]          [phone]     CARDIOTHORACIC SURGERY OFFICE NOTE  Referring Provider is Billy Fischer, MD PCP is Provider Not In System   HPI:  Patient returns for follow-up of spontaneous pneumothorax.  She was last seen here in the office 09/28/2011. Since then the patient has done quite well. At this point the patient denies any symptoms of shortness of breath. She has not had any further chest pain. She has no complaints and she looks for taking a trip to [place] with her family in the near future. Remainder of her review of system is unremarkable.   Current Outpatient Prescriptions  Medication Sig Dispense Refill  . adapalene (DIFFERIN) 0.1 % cream Apply 1 application topically at bedtime.        Marland Kitchen doxycycline (VIBRAMYCIN) 100 MG capsule Take 100 mg by mouth 2 (two) times daily.        . Multiple Vitamin (MULTIVITAMIN) tablet Take 1 tablet by mouth daily.        . naproxen (NAPROSYN) 500 MG tablet Take 500 mg by mouth as needed. For cramps      . Norethin Ace-Eth Estrad-FE (LOESTRIN FE 1/20 PO) Take 1 tablet by mouth daily.        Marland Kitchen oxyCODONE-acetaminophen (PERCOCET) 5-325 MG per tablet Take 1 tablet by mouth every 6 (six) hours as needed for pain. For cramps  30 tablet  0      Physical Exam:   BP 104/70  Pulse 60  Resp 16  Ht 5' 3.5" (1.613 m)  Wt 124 lb (56.246 kg)  BMI 21.62 kg/m2  SpO2 99%  LMP 10/05/2011  HEENT:  Unremarkable  Chest:   Clear with symmetrical breath sounds  CV:   Regular rate and rhythm  Abdomen:  Soft and nontender  Extremities:  Warm and well-perfused  Diagnostic Tests:  Chest x-ray performed today demonstrates no residual pneumothorax. The previous pneumothorax appears to have completely resolved.   Impression:  Resolution of the previous right spontaneous pneumothorax.  Plan:  I've instructed the patient to continue to refrain from any heavy lifting or  strenuous physical exertion for another month. She also understands that she should not be traveling via airline for another month. She plans to visit [place] with her family. I've cautioned her to stay off of roller coasters or other attractions that could conceivably cause any sort of strenuous activity or physical trauma. She understands that there is a small but significant chance that her pneumothorax could recur in the future. All of her questions been addressed. She will call and return to see [place] only as needed.   Salvatore Decent. Cornelius Moras, MD 10/19/2011 4:26 PM

## 2011-10-19 NOTE — Patient Instructions (Signed)
No heavy lifting or strenuous physical activity for one month. No travel via airline for at least one month.

## 2012-01-27 ENCOUNTER — Emergency Department (HOSPITAL_COMMUNITY): Payer: PRIVATE HEALTH INSURANCE

## 2012-01-27 ENCOUNTER — Emergency Department (HOSPITAL_COMMUNITY)
Admission: EM | Admit: 2012-01-27 | Discharge: 2012-01-28 | Disposition: A | Discharge status: Home Health | Payer: PRIVATE HEALTH INSURANCE | Attending: Emergency Medicine | Admitting: Emergency Medicine

## 2012-01-27 ENCOUNTER — Encounter (HOSPITAL_COMMUNITY): Payer: Self-pay | Admitting: Emergency Medicine

## 2012-01-27 DIAGNOSIS — M542 Cervicalgia: Secondary | ICD-10-CM | POA: Insufficient documentation

## 2012-01-27 DIAGNOSIS — M545 Low back pain, unspecified: Secondary | ICD-10-CM | POA: Insufficient documentation

## 2012-01-27 DIAGNOSIS — S39012A Strain of muscle, fascia and tendon of lower back, initial encounter: Secondary | ICD-10-CM

## 2012-01-27 DIAGNOSIS — Z79899 Other long term (current) drug therapy: Secondary | ICD-10-CM | POA: Insufficient documentation

## 2012-01-27 DIAGNOSIS — S161XXA Strain of muscle, fascia and tendon at neck level, initial encounter: Secondary | ICD-10-CM

## 2012-01-27 DIAGNOSIS — S339XXA Sprain of unspecified parts of lumbar spine and pelvis, initial encounter: Secondary | ICD-10-CM | POA: Insufficient documentation

## 2012-01-27 DIAGNOSIS — S139XXA Sprain of joints and ligaments of unspecified parts of neck, initial encounter: Secondary | ICD-10-CM | POA: Insufficient documentation
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
## 2012-01-28 MED ORDER — OXYCODONE-ACETAMINOPHEN 5-325 MG PO TABS
1.0000 | ORAL_TABLET | Freq: Once | ORAL | Status: AC
  Administered 2012-01-28: 1 via ORAL
  Filled 2012-01-28: qty 1

## 2012-01-28 MED ORDER — CYCLOBENZAPRINE HCL 10 MG PO TABS: 10.0000 mg | ORAL_TABLET | Freq: Three times a day (TID) | ORAL | Status: AC | PRN

## 2012-01-28 MED ORDER — OXYCODONE-ACETAMINOPHEN 5-325 MG PO TABS: 1.0000 | ORAL_TABLET | ORAL | Status: AC | PRN

## 2012-01-28 MED ORDER — CYCLOBENZAPRINE HCL 10 MG PO TABS
10.0000 mg | ORAL_TABLET | Freq: Once | ORAL | Status: AC
  Administered 2012-01-28: 10 mg via ORAL
  Filled 2012-01-28: qty 1

## 2012-07-12 ENCOUNTER — Ambulatory Visit (INDEPENDENT_AMBULATORY_CARE_PROVIDER_SITE_OTHER): Payer: BC Managed Care – PPO | Admitting: Family Medicine

## 2012-07-12 VITALS — BP 96/54 | HR 89 | Temp 99.4°F | Resp 18 | Ht 64.0 in | Wt 127.0 lb

## 2012-07-12 DIAGNOSIS — B349 Viral infection, unspecified: Secondary | ICD-10-CM

## 2012-07-12 DIAGNOSIS — B9789 Other viral agents as the cause of diseases classified elsewhere: Secondary | ICD-10-CM

## 2012-07-12 MED ORDER — ACETAMINOPHEN-CODEINE #3 300-30 MG PO TABS: 1.0000 | ORAL_TABLET | ORAL | Status: AC | PRN

## 2012-07-12 MED ORDER — BENZONATATE 100 MG PO CAPS: 100.0000 mg | ORAL_CAPSULE | Freq: Three times a day (TID) | ORAL | Status: AC | PRN

## 2012-07-12 NOTE — Patient Instructions (Signed)
Viral Syndrome You or your child has Viral Syndrome. It is the most common infection causing "colds" and infections in the nose, throat, sinuses, and breathing tubes. Sometimes the infection causes nausea, vomiting, or diarrhea. The germ that causes the infection is a virus. No antibiotic or other medicine will kill it. There are medicines that you can take to make you or your child more comfortable.  HOME CARE INSTRUCTIONS   Rest in bed until you start to feel better.   If you have diarrhea or vomiting, eat small amounts of crackers and toast. Soup is helpful.   Do not give aspirin or medicine that contains aspirin to children.   Only take over-the-counter or prescription medicines for pain, discomfort, or fever as directed by your caregiver.  SEEK IMMEDIATE MEDICAL CARE IF:   You or your child has not improved within one week.   You or your child has pain that is not at least partially relieved by over-the-counter medicine.   Thick, colored mucus or blood is coughed up.   Discharge from the nose becomes thick yellow or green.   Diarrhea or vomiting gets worse.   There is any major change in your or your child's condition.   You or your child develops a skin rash, stiff neck, severe headache, or are unable to hold down food or fluid.   You or your child has an oral temperature above 102 F (38.9 C), not controlled by medicine.   Your baby is older than 3 months with a rectal temperature of 102 F (38.9 C) or higher.   Your baby is 43 months old or younger with a rectal temperature of 100.4 F (38 C) or higher.   Rest, fluids.  RTC or to ER if any worsening headache, neck pain or vision changes immediately. Document Released: 10/04/2006 Document Revised: 10/08/2011 Document Reviewed: 10/05/2007 [facility] Patient Information 2012 [place], [place].

## 2012-07-12 NOTE — Progress Notes (Signed)
  Subjective:    Patient ID: Claudia Snyder, female    DOB: 28-Jul-1986, 26 y.o.   MRN: [id number]  HPI presents today with fever, cough and headache. Saturday started - 2d prev - feeling weak, cough, and fever of 101. Last night fever 102.1 and had blurred vision x 2 hrs. The blurred vision was constant then went away on own. When she coughs she gets a sharp pain in head quick and goes away. Has been using NyQuil at night. No ear pain, nasal congestion, CP, or SOB. Normal BM and appetite. Feels kind of achy all over. Neck and back achy some but not difficulty moving.     Review of Systems  Constitutional: Positive for fever and fatigue. Negative for chills and appetite change.  HENT: Positive for sneezing and neck pain. Negative for hearing loss, ear pain, congestion, facial swelling, rhinorrhea, neck stiffness and ear discharge.   Eyes: Positive for photophobia and visual disturbance. Negative for pain and discharge.  Respiratory: Positive for cough. Negative for chest tightness, shortness of breath and wheezing.   Cardiovascular: Negative for chest pain.  Musculoskeletal: Positive for back pain and arthralgias.  Neurological: Positive for light-headedness and headaches. Negative for syncope and weakness.  Psychiatric/Behavioral: Negative for confusion.       Objective:   Physical Exam  Constitutional: She is oriented to person, place, and time. She appears well-developed and well-nourished. No distress.  HENT:  Head: Normocephalic and atraumatic.  Right Ear: Tympanic membrane, external ear and ear canal normal.  Left Ear: Tympanic membrane, external ear and ear canal normal.  Nose: Nose normal.  Mouth/Throat: Oropharynx is clear and moist. No oropharyngeal exudate.  Eyes: Conjunctivae and EOM are normal. Pupils are equal, round, and reactive to light. Right eye exhibits no discharge. Left eye exhibits no discharge. No scleral icterus.  Fundoscopic exam:      The right eye  shows no arteriolar narrowing, no hemorrhage and no papilledema.       The left eye shows no arteriolar narrowing, no hemorrhage and no papilledema.  Neck: Normal range of motion and full passive range of motion without pain. Neck supple. No tracheal tenderness, no spinous process tenderness and no muscular tenderness present. No rigidity. No tracheal deviation and normal range of motion present. No Brudzinski's sign and no Kernig's sign noted. No thyromegaly present.  Cardiovascular: Normal rate, regular rhythm, normal heart sounds and intact distal pulses.   Pulmonary/Chest: Effort normal and breath sounds normal. No respiratory distress. She has no wheezes. She has no rales.  Musculoskeletal: Normal range of motion. She exhibits no edema and no tenderness.  Lymphadenopathy:    She has no cervical adenopathy.  Neurological: She is alert and oriented to person, place, and time.  Skin: Skin is warm and dry. No rash noted. She is not diaphoretic. No erythema.  Psychiatric: She has a normal mood and affect.          Assessment & Plan:  1. Viral syndrome - Gave warning signs for meningeal illness or other (tick-born illness) etc to immed RTC w/. Rec rest, fluids and conservative care as reassuring that exam is so normal and certainly no meningeal signs or increased ICP concerns. Follow - note for work to stay out through Fri. Ok for tylenol #3 to reduce cough and HA from them and to help sleep.

## 2012-07-13 NOTE — Progress Notes (Signed)
Reviewed and agree.

## 2012-08-02 ENCOUNTER — Encounter (HOSPITAL_COMMUNITY): Payer: Self-pay | Admitting: *Deleted

## 2012-08-02 ENCOUNTER — Emergency Department (HOSPITAL_COMMUNITY)
Admission: EM | Admit: 2012-08-02 | Discharge: 2012-08-02 | Disposition: A | Discharge status: Home / Self Care | Payer: BC Managed Care – PPO | Source: Home / Self Care | Attending: Family Medicine | Admitting: Family Medicine

## 2012-08-02 DIAGNOSIS — J069 Acute upper respiratory infection, unspecified: Secondary | ICD-10-CM

## 2012-08-02 LAB — POCT RAPID STREP A: Streptococcus, Group A Screen (Direct): NEGATIVE

## 2012-08-02 NOTE — ED Notes (Signed)
Pt  Reports  Symptoms  Of  sorethroat  With  painfull  Swallowing   -  Symptoms    X  4  Days      Pt  Sitting  Upright on  Exam table  Speaking  In   Complete  sentances

## 2012-08-02 NOTE — ED Provider Notes (Signed)
History     CSN: [id number]  Arrival date & time 08/02/12  1705   First MD Initiated Contact with Patient 08/02/12 1717      Chief Complaint  Patient presents with  . Sore Throat    (Consider location/radiation/quality/duration/timing/severity/associated sxs/prior treatment) Patient is a 26 y.o. female presenting with pharyngitis. The history is provided by the patient.  Sore Throat This is a new problem. The current episode started more than 2 days ago. The problem has been gradually worsening. The symptoms are aggravated by swallowing.    Past Medical History  Diagnosis Date  . Spontaneous pneumothorax   . Heart murmur   . Shortness of breath   . Headache   . Anemia     History reviewed. No pertinent past surgical history.  Family History  Problem Relation Age of Onset  . Asthma Sister   . Cancer Maternal Grandmother     ? type    History  Substance Use Topics  . Smoking status: Never Smoker   . Smokeless tobacco: Never Used   Comment: Pt states used to go to the "hookah bars" ans smoke flavored tobacco  . Alcohol Use: Yes     occ;1 or 2 x month    OB History    Grav Para Term Preterm Abortions TAB SAB Ect Mult Living                  Review of Systems  Constitutional: Negative.  Negative for fever.  HENT: Positive for sore throat. Negative for congestion, rhinorrhea and postnasal drip.     Allergies  Review of patient's allergies indicates no known allergies.  Home Medications   Current Outpatient Rx  Name Route Sig Dispense Refill  . DOXYCYCLINE HYCLATE 100 MG PO CAPS Oral Take 100 mg by mouth 2 (two) times daily.      Marland Kitchen MINOCYCLINE HCL 100 MG PO CAPS Oral Take 100 mg by mouth 2 (two) times daily.    Marland Kitchen NAPROXEN 500 MG PO TABS Oral Take 500 mg by mouth as needed. For cramps    . LOESTRIN FE 1/20 PO Oral Take 1 tablet by mouth daily.      Marland Kitchen MICROGESTIN 1.5/30 PO Oral Take by mouth.    . OXYCODONE-ACETAMINOPHEN 5-325 MG PO TABS Oral Take 1 tablet  by mouth every 6 (six) hours as needed for pain. For cramps 30 tablet 0    BP 100/59  Pulse 65  Temp 98.6 F (37 C) (Oral)  Resp 16  SpO2 98%  LMP 06/24/2012  Physical Exam  Nursing note and vitals reviewed. Constitutional: She is oriented to person, place, and time. She appears well-developed and well-nourished.  HENT:  Head: Normocephalic.  Right Ear: External ear normal.  Left Ear: External ear normal.  Mouth/Throat: Mucous membranes are normal. Posterior oropharyngeal erythema present. No oropharyngeal exudate or posterior oropharyngeal edema.  Eyes: Conjunctivae normal are normal. Pupils are equal, round, and reactive to light.  Neck: Normal range of motion. Neck supple.  Cardiovascular: Regular rhythm and normal heart sounds.   Pulmonary/Chest: Breath sounds normal. She has no wheezes.  Lymphadenopathy:    She has no cervical adenopathy.  Neurological: She is alert and oriented to person, place, and time.  Skin: Skin is warm and dry.    ED Course  Procedures (including critical care time)   Labs Reviewed  POCT RAPID STREP A ([facility] CARE ONLY)   No results found.   1. URI (upper respiratory infection)  MDM          Linna Hoff, MD 08/02/12 [phone]

## 2012-09-27 ENCOUNTER — Other Ambulatory Visit: Payer: Self-pay | Admitting: Obstetrics and Gynecology

## 2012-10-04 ENCOUNTER — Encounter (HOSPITAL_COMMUNITY): Payer: Self-pay | Admitting: Pharmacy Technician

## 2012-10-04 ENCOUNTER — Encounter (HOSPITAL_COMMUNITY): Payer: Self-pay

## 2012-10-04 ENCOUNTER — Encounter (HOSPITAL_COMMUNITY)
Admission: RE | Admit: 2012-10-04 | Discharge: 2012-10-04 | Disposition: A | Discharge status: Home / Self Care | Payer: BC Managed Care – PPO | Source: Ambulatory Visit | Attending: Obstetrics and Gynecology | Admitting: Obstetrics and Gynecology

## 2012-10-04 HISTORY — DX: Other specified postprocedural states: Z98.890

## 2012-10-04 HISTORY — DX: Nausea with vomiting, unspecified: R11.2

## 2012-10-04 LAB — CBC
HCT: 40.5 % (ref 36.0–46.0)
Hemoglobin: 13.3 g/dL (ref 12.0–15.0)
MCH: 26.1 pg (ref 26.0–34.0)
MCHC: 32.8 g/dL (ref 30.0–36.0)
RBC: 5.1 MIL/uL (ref 3.87–5.11)

## 2012-10-04 LAB — SURGICAL PCR SCREEN: Staphylococcus aureus: NEGATIVE

## 2012-10-04 NOTE — Patient Instructions (Addendum)
[address] Dulski  10/04/2012   Your procedure is scheduled on:  10/05/12  Enter through the Main Entrance of [facility] at 6 AM.  Pick up the phone at the desk and dial [phone].   Call this number if you have problems the morning of surgery: [phone]   Remember:   Do not eat food:After Midnight.  Do not drink clear liquids: After Midnight.  Take these medicines the morning of surgery with A SIP OF WATER: NA   Do not wear jewelry, make-up or nail polish.  Do not wear lotions, powders, or perfumes. You may wear deodorant.  Do not shave 48 hours prior to surgery.  Do not bring valuables to the hospital.  Contacts, dentures or bridgework may not be worn into surgery.  Leave suitcase in the car. After surgery it may be brought to your room.  For patients admitted to the hospital, checkout time is 11:00 AM the day of discharge.   Patients discharged the day of surgery will not be allowed to drive home.  Name and phone number of your driver: NA  Special Instructions: Shower using CHG 2 nights before surgery and the night before surgery.  If you shower the day of surgery use CHG.  Use special wash - you have one bottle of CHG for all showers.  You should use approximately 1/3 of the bottle for each shower.   Please read over the following fact sheets that you were given: MRSA Information

## 2012-10-05 ENCOUNTER — Encounter (HOSPITAL_COMMUNITY): Payer: Self-pay | Admitting: Anesthesiology

## 2012-10-05 ENCOUNTER — Ambulatory Visit (HOSPITAL_COMMUNITY)
Admission: RE | Admit: 2012-10-05 | Discharge: 2012-10-05 | Disposition: A | Discharge status: Home / Self Care | Payer: BC Managed Care – PPO | Source: Ambulatory Visit | Attending: Obstetrics and Gynecology | Admitting: Obstetrics and Gynecology

## 2012-10-05 ENCOUNTER — Ambulatory Visit (HOSPITAL_COMMUNITY): Payer: BC Managed Care – PPO | Admitting: Anesthesiology

## 2012-10-05 ENCOUNTER — Encounter (HOSPITAL_COMMUNITY)
Admission: RE | Disposition: A | Discharge status: Home / Self Care | Payer: Self-pay | Source: Ambulatory Visit | Attending: Obstetrics and Gynecology

## 2012-10-05 ENCOUNTER — Encounter (HOSPITAL_COMMUNITY): Payer: Self-pay | Admitting: General Practice

## 2012-10-05 DIAGNOSIS — N920 Excessive and frequent menstruation with regular cycle: Secondary | ICD-10-CM | POA: Insufficient documentation

## 2012-10-05 DIAGNOSIS — Z01812 Encounter for preprocedural laboratory examination: Secondary | ICD-10-CM | POA: Insufficient documentation

## 2012-10-05 DIAGNOSIS — Z01818 Encounter for other preprocedural examination: Secondary | ICD-10-CM | POA: Insufficient documentation

## 2012-10-05 DIAGNOSIS — N803 Endometriosis of pelvic peritoneum, unspecified: Secondary | ICD-10-CM | POA: Insufficient documentation

## 2012-10-05 DIAGNOSIS — D279 Benign neoplasm of unspecified ovary: Secondary | ICD-10-CM | POA: Insufficient documentation

## 2012-10-05 DIAGNOSIS — N946 Dysmenorrhea, unspecified: Secondary | ICD-10-CM | POA: Insufficient documentation

## 2012-10-05 DIAGNOSIS — N809 Endometriosis, unspecified: Secondary | ICD-10-CM

## 2012-10-05 DIAGNOSIS — N840 Polyp of corpus uteri: Secondary | ICD-10-CM | POA: Insufficient documentation

## 2012-10-05 HISTORY — PX: POLYPECTOMY: SHX5525

## 2012-10-05 HISTORY — PX: HYSTEROSCOPY WITH D & C: SHX1775

## 2012-10-05 HISTORY — PX: ROBOTIC ASSISTED LAPAROSCOPIC OVARIAN CYSTECTOMY: SHX6081

## 2012-10-05 SURGERY — ROBOTIC ASSISTED LAPAROSCOPIC OVARIAN CYSTECTOMY
Anesthesia: General | Site: Vagina

## 2012-10-05 MED ORDER — BUPIVACAINE HCL (PF) 0.25 % IJ SOLN
INTRAMUSCULAR | Status: AC
  Filled 2012-10-05: qty 30

## 2012-10-05 MED ORDER — IBUPROFEN 800 MG PO TABS: 800.0000 mg | ORAL_TABLET | Freq: Three times a day (TID) | ORAL | Status: DC | PRN

## 2012-10-05 MED ORDER — PHENYLEPHRINE 40 MCG/ML (10ML) SYRINGE FOR IV PUSH (FOR BLOOD PRESSURE SUPPORT)
PREFILLED_SYRINGE | INTRAVENOUS | Status: AC
  Filled 2012-10-05: qty 5

## 2012-10-05 MED ORDER — HYDROMORPHONE HCL PF 1 MG/ML IJ SOLN
INTRAMUSCULAR | Status: AC
  Filled 2012-10-05: qty 1

## 2012-10-05 MED ORDER — ROCURONIUM BROMIDE 50 MG/5ML IV SOLN
INTRAVENOUS | Status: AC
  Filled 2012-10-05: qty 1

## 2012-10-05 MED ORDER — LIDOCAINE HCL (CARDIAC) 20 MG/ML IV SOLN
INTRAVENOUS | Status: DC | PRN
  Administered 2012-10-05: 50 mg via INTRAVENOUS

## 2012-10-05 MED ORDER — ONDANSETRON HCL 4 MG/2ML IJ SOLN: 4.0000 mg | Freq: Once | INTRAMUSCULAR | Status: DC | PRN

## 2012-10-05 MED ORDER — PHENYLEPHRINE HCL 10 MG/ML IJ SOLN
INTRAMUSCULAR | Status: DC | PRN
  Administered 2012-10-05: 80 ug via INTRAVENOUS

## 2012-10-05 MED ORDER — KETOROLAC TROMETHAMINE 30 MG/ML IJ SOLN
INTRAMUSCULAR | Status: AC
  Filled 2012-10-05: qty 1

## 2012-10-05 MED ORDER — KETOROLAC TROMETHAMINE 30 MG/ML IJ SOLN
INTRAMUSCULAR | Status: DC | PRN
  Administered 2012-10-05: 30 mg via INTRAVENOUS

## 2012-10-05 MED ORDER — HYDROCODONE-ACETAMINOPHEN 5-500 MG PO TABS: 1.0000 | ORAL_TABLET | Freq: Four times a day (QID) | ORAL | Status: DC | PRN

## 2012-10-05 MED ORDER — LACTATED RINGERS IV SOLN
INTRAVENOUS | Status: DC
  Administered 2012-10-05 (×3): via INTRAVENOUS

## 2012-10-05 MED ORDER — DEXAMETHASONE SODIUM PHOSPHATE 10 MG/ML IJ SOLN
INTRAMUSCULAR | Status: AC
  Filled 2012-10-05: qty 1

## 2012-10-05 MED ORDER — ARTIFICIAL TEARS OP OINT
TOPICAL_OINTMENT | OPHTHALMIC | Status: AC
  Filled 2012-10-05: qty 3.5

## 2012-10-05 MED ORDER — BUPIVACAINE HCL (PF) 0.25 % IJ SOLN
INTRAMUSCULAR | Status: DC | PRN
  Administered 2012-10-05: 8 mL

## 2012-10-05 MED ORDER — GLYCOPYRROLATE 0.2 MG/ML IJ SOLN
INTRAMUSCULAR | Status: AC
  Filled 2012-10-05: qty 1

## 2012-10-05 MED ORDER — NEOSTIGMINE METHYLSULFATE 1 MG/ML IJ SOLN
INTRAMUSCULAR | Status: DC | PRN
  Administered 2012-10-05 (×2): 1 mg via INTRAVENOUS

## 2012-10-05 MED ORDER — ONDANSETRON HCL 4 MG/2ML IJ SOLN
INTRAMUSCULAR | Status: AC
  Filled 2012-10-05: qty 2

## 2012-10-05 MED ORDER — FENTANYL CITRATE 0.05 MG/ML IJ SOLN
25.0000 ug | INTRAMUSCULAR | Status: DC | PRN
  Administered 2012-10-05: 50 ug via INTRAVENOUS

## 2012-10-05 MED ORDER — KETOROLAC TROMETHAMINE 30 MG/ML IJ SOLN: 15.0000 mg | Freq: Once | INTRAMUSCULAR | Status: DC | PRN

## 2012-10-05 MED ORDER — ROCURONIUM BROMIDE 100 MG/10ML IV SOLN
INTRAVENOUS | Status: DC | PRN
  Administered 2012-10-05: 50 mg via INTRAVENOUS
  Administered 2012-10-05 (×2): 10 mg via INTRAVENOUS
  Administered 2012-10-05: 5 mg via INTRAVENOUS

## 2012-10-05 MED ORDER — FENTANYL CITRATE 0.05 MG/ML IJ SOLN
INTRAMUSCULAR | Status: DC | PRN
  Administered 2012-10-05: 50 ug via INTRAVENOUS
  Administered 2012-10-05 (×2): 25 ug via INTRAVENOUS
  Administered 2012-10-05: 150 ug via INTRAVENOUS

## 2012-10-05 MED ORDER — GLYCINE 1.5 % IR SOLN
Status: DC | PRN
  Administered 2012-10-05: 3000 mL

## 2012-10-05 MED ORDER — GLYCOPYRROLATE 0.2 MG/ML IJ SOLN
INTRAMUSCULAR | Status: DC | PRN
  Administered 2012-10-05 (×2): 0.2 mg via INTRAVENOUS

## 2012-10-05 MED ORDER — ROPIVACAINE HCL 5 MG/ML IJ SOLN
INTRAMUSCULAR | Status: AC
  Filled 2012-10-05: qty 60

## 2012-10-05 MED ORDER — FENTANYL CITRATE 0.05 MG/ML IJ SOLN
INTRAMUSCULAR | Status: AC
  Filled 2012-10-05: qty 5

## 2012-10-05 MED ORDER — LIDOCAINE HCL (CARDIAC) 20 MG/ML IV SOLN
INTRAVENOUS | Status: AC
Start: 2012-10-05 — End: 2012-10-05
  Filled 2012-10-05: qty 5

## 2012-10-05 MED ORDER — DEXAMETHASONE SODIUM PHOSPHATE 10 MG/ML IJ SOLN
INTRAMUSCULAR | Status: DC | PRN
  Administered 2012-10-05: 10 mg via INTRAVENOUS

## 2012-10-05 MED ORDER — METOCLOPRAMIDE HCL 5 MG/ML IJ SOLN
INTRAMUSCULAR | Status: AC
  Administered 2012-10-05: 10 mg via INTRAVENOUS
  Filled 2012-10-05: qty 2

## 2012-10-05 MED ORDER — LACTATED RINGERS IV SOLN: INTRAVENOUS | Status: DC | PRN

## 2012-10-05 MED ORDER — HYDROMORPHONE HCL PF 1 MG/ML IJ SOLN
INTRAMUSCULAR | Status: DC | PRN
  Administered 2012-10-05 (×2): 1 mg via INTRAVENOUS

## 2012-10-05 MED ORDER — LACTATED RINGERS IR SOLN
Status: DC | PRN
  Administered 2012-10-05 (×2): 3000 mL

## 2012-10-05 MED ORDER — ROPIVACAINE HCL 5 MG/ML IJ SOLN
INTRAMUSCULAR | Status: DC | PRN
  Administered 2012-10-05: 120 mL

## 2012-10-05 MED ORDER — FENTANYL CITRATE 0.05 MG/ML IJ SOLN
INTRAMUSCULAR | Status: AC
  Administered 2012-10-05: 50 ug via INTRAVENOUS
  Filled 2012-10-05: qty 2

## 2012-10-05 MED ORDER — METOCLOPRAMIDE HCL 5 MG/ML IJ SOLN
10.0000 mg | Freq: Once | INTRAMUSCULAR | Status: AC
  Administered 2012-10-05: 10 mg via INTRAVENOUS

## 2012-10-05 MED ORDER — MIDAZOLAM HCL 5 MG/5ML IJ SOLN
INTRAMUSCULAR | Status: DC | PRN
  Administered 2012-10-05: 2 mg via INTRAVENOUS

## 2012-10-05 MED ORDER — ACETAMINOPHEN 10 MG/ML IV SOLN
1000.0000 mg | Freq: Once | INTRAVENOUS | Status: AC
  Administered 2012-10-05: 1000 mg via INTRAVENOUS
  Filled 2012-10-05: qty 100

## 2012-10-05 MED ORDER — PROPOFOL 10 MG/ML IV EMUL
INTRAVENOUS | Status: DC | PRN
  Administered 2012-10-05: 150 mg via INTRAVENOUS

## 2012-10-05 MED ORDER — ARTIFICIAL TEARS OP OINT
TOPICAL_OINTMENT | OPHTHALMIC | Status: DC | PRN
  Administered 2012-10-05: 1 via OPHTHALMIC

## 2012-10-05 MED ORDER — MEPERIDINE HCL 25 MG/ML IJ SOLN: 6.2500 mg | INTRAMUSCULAR | Status: DC | PRN

## 2012-10-05 MED ORDER — NEOSTIGMINE METHYLSULFATE 1 MG/ML IJ SOLN
INTRAMUSCULAR | Status: AC
  Filled 2012-10-05: qty 10

## 2012-10-05 MED ORDER — ONDANSETRON HCL 4 MG/2ML IJ SOLN
INTRAMUSCULAR | Status: DC | PRN
  Administered 2012-10-05: 4 mg via INTRAVENOUS

## 2012-10-05 MED ORDER — MIDAZOLAM HCL 2 MG/2ML IJ SOLN
INTRAMUSCULAR | Status: AC
  Filled 2012-10-05: qty 2

## 2012-10-05 MED ORDER — CHLOROPROCAINE HCL 1 % IJ SOLN
INTRAMUSCULAR | Status: AC
  Filled 2012-10-05: qty 30

## 2012-10-05 MED ORDER — PROPOFOL 10 MG/ML IV EMUL
INTRAVENOUS | Status: AC
  Filled 2012-10-05: qty 20

## 2012-10-05 MED ORDER — ACETAMINOPHEN 10 MG/ML IV SOLN
INTRAVENOUS | Status: AC
  Filled 2012-10-05: qty 100

## 2012-10-05 SURGICAL SUPPLY — 74 items
ADH SKN CLS APL DERMABOND .7 (GAUZE/BANDAGES/DRESSINGS) ×2
BAG SPEC RTRVL LRG 6X4 10 (ENDOMECHANICALS) ×6
BAG URINE DRAINAGE (UROLOGICAL SUPPLIES) ×2 IMPLANT
BARRIER ADHS 3X4 INTERCEED (GAUZE/BANDAGES/DRESSINGS) ×5 IMPLANT
BRR ADH 4X3 ABS CNTRL BYND (GAUZE/BANDAGES/DRESSINGS) ×6
CABLE HIGH FREQUENCY MONO STRZ (ELECTRODE) ×3 IMPLANT
CANISTER SUCTION 2500CC (MISCELLANEOUS) ×4 IMPLANT
CATH FOLEY 3WAY  5CC 16FR (CATHETERS)
CATH FOLEY 3WAY 5CC 16FR (CATHETERS) ×2 IMPLANT
CATH ROBINSON RED A/P 16FR (CATHETERS) ×2 IMPLANT
CHLORAPREP W/TINT 26ML (MISCELLANEOUS) ×3 IMPLANT
CLOTH BEACON ORANGE TIMEOUT ST (SAFETY) ×3 IMPLANT
CONT PATH 16OZ SNAP LID 3702 (MISCELLANEOUS) ×3 IMPLANT
CONTAINER PREFILL 10% NBF 60ML (FORM) ×7 IMPLANT
COVER MAYO STAND STRL (DRAPES) ×3 IMPLANT
COVER TABLE BACK 60X90 (DRAPES) ×6 IMPLANT
COVER TIP SHEARS 8 DVNC (MISCELLANEOUS) ×2 IMPLANT
COVER TIP SHEARS 8MM DA VINCI (MISCELLANEOUS) ×2
DECANTER SPIKE VIAL GLASS SM (MISCELLANEOUS) ×3 IMPLANT
DERMABOND ADVANCED (GAUZE/BANDAGES/DRESSINGS) ×1
DERMABOND ADVANCED .7 DNX12 (GAUZE/BANDAGES/DRESSINGS) ×2 IMPLANT
DEVICE TROCAR PUNCTURE CLOSURE (ENDOMECHANICALS) IMPLANT
DRAPE HUG U DISPOSABLE (DRAPE) ×3 IMPLANT
DRAPE LG THREE QUARTER DISP (DRAPES) ×6 IMPLANT
DRAPE WARM FLUID 44X44 (DRAPE) ×3 IMPLANT
DRESSING TELFA 8X3 (GAUZE/BANDAGES/DRESSINGS) ×4 IMPLANT
ELECT REM PT RETURN 9FT ADLT (ELECTROSURGICAL) ×3
ELECTRODE REM PT RTRN 9FT ADLT (ELECTROSURGICAL) ×2 IMPLANT
EVACUATOR SMOKE 8.L (FILTER) ×3 IMPLANT
GAUZE VASELINE 3X9 (GAUZE/BANDAGES/DRESSINGS) IMPLANT
GLOVE BIO SURGEON STRL SZ 6.5 (GLOVE) ×6 IMPLANT
GLOVE BIOGEL PI IND STRL 7.0 (GLOVE) ×6 IMPLANT
GLOVE BIOGEL PI INDICATOR 7.0 (GLOVE) ×3
GOWN STRL REIN XL XLG (GOWN DISPOSABLE) ×19 IMPLANT
HEMOSTAT SURGICEL 4X8 (HEMOSTASIS) ×1 IMPLANT
KIT ACCESSORY DA VINCI DISP (KITS) ×1
KIT ACCESSORY DVNC DISP (KITS) ×2 IMPLANT
LOOP ANGLED CUTTING 22FR (CUTTING LOOP) IMPLANT
NDL INSUFFLATION 14GA 120MM (NEEDLE) ×2 IMPLANT
NEEDLE INSUFFLATION 14GA 120MM (NEEDLE) ×3 IMPLANT
NS IRRIG 1000ML POUR BTL (IV SOLUTION) ×6 IMPLANT
OCCLUDER COLPOPNEUMO (BALLOONS) IMPLANT
PACK HYSTEROSCOPY LF (CUSTOM PROCEDURE TRAY) ×3 IMPLANT
PACK LAVH (CUSTOM PROCEDURE TRAY) ×3 IMPLANT
PAD OB MATERNITY 4.3X12.25 (PERSONAL CARE ITEMS) ×3 IMPLANT
PAD PREP 24X48 CUFFED NSTRL (MISCELLANEOUS) ×6 IMPLANT
PLUG CATH AND CAP STER (CATHETERS) ×2 IMPLANT
POUCH SPECIMEN RETRIEVAL 10MM (ENDOMECHANICALS) ×3 IMPLANT
PROTECTOR NERVE ULNAR (MISCELLANEOUS) ×8 IMPLANT
SCISSORS LAP 5X35 DISP (ENDOMECHANICALS) IMPLANT
SET CYSTO W/LG BORE CLAMP LF (SET/KITS/TRAYS/PACK) IMPLANT
SET IRRIG TUBING LAPAROSCOPIC (IRRIGATION / IRRIGATOR) ×3 IMPLANT
SOLUTION ELECTROLUBE (MISCELLANEOUS) ×3 IMPLANT
SUT VIC AB 0 CT1 27 (SUTURE)
SUT VIC AB 0 CT1 27XBRD ANTBC (SUTURE) ×10 IMPLANT
SUT VICRYL 0 UR6 27IN ABS (SUTURE) ×3 IMPLANT
SUT VICRYL 4-0 PS2 18IN ABS (SUTURE) ×6 IMPLANT
SYR 50ML LL SCALE MARK (SYRINGE) ×3 IMPLANT
SYSTEM CONVERTIBLE TROCAR (TROCAR) IMPLANT
TIP UTERINE 5.1X6CM LAV DISP (MISCELLANEOUS) IMPLANT
TIP UTERINE 6.7X10CM GRN DISP (MISCELLANEOUS) IMPLANT
TIP UTERINE 6.7X6CM WHT DISP (MISCELLANEOUS) ×1 IMPLANT
TIP UTERINE 6.7X8CM BLUE DISP (MISCELLANEOUS) IMPLANT
TOWEL OR 17X24 6PK STRL BLUE (TOWEL DISPOSABLE) ×9 IMPLANT
TRAY FOLEY BAG SILVER LF 14FR (CATHETERS) ×3 IMPLANT
TROCAR 12M 150ML BLUNT (TROCAR) IMPLANT
TROCAR DISP BLADELESS 8 DVNC (TROCAR) ×2 IMPLANT
TROCAR DISP BLADELESS 8MM (TROCAR) ×1
TROCAR XCEL 12X100 BLDLESS (ENDOMECHANICALS) ×1 IMPLANT
TROCAR Z-THREAD 12X150 (TROCAR) ×3 IMPLANT
TROCAR Z-THREAD BLADED 12X100M (TROCAR) ×2 IMPLANT
TUBING FILTER THERMOFLATOR (ELECTROSURGICAL) ×3 IMPLANT
WARMER LAPAROSCOPE (MISCELLANEOUS) ×3 IMPLANT
WATER STERILE IRR 1000ML POUR (IV SOLUTION) ×5 IMPLANT

## 2012-10-05 NOTE — Anesthesia Postprocedure Evaluation (Signed)
Anesthesia Post Note  Patient: Claudia Snyder  Procedure(s) Performed: Procedure(s) (LRB): ROBOTIC ASSISTED LAPAROSCOPIC OVARIAN CYSTECTOMY (Bilateral) DILATATION AND CURETTAGE /HYSTEROSCOPY (N/A) POLYPECTOMY (N/A)  Anesthesia type: General  Patient location: PACU  Post pain: Pain level controlled  Post assessment: Post-op Vital signs reviewed  Last Vitals:  Filed Vitals:   10/05/12 1300  BP: 118/75  Pulse: 70  Temp:   Resp: 16    Post vital signs: Reviewed  Level of consciousness: sedated  Complications: No apparent anesthesia complications

## 2012-10-05 NOTE — Transfer of Care (Signed)
Immediate Anesthesia Transfer of Care Note  Patient: Claudia Snyder  Procedure(s) Performed: Procedure(s) (LRB) with comments: ROBOTIC ASSISTED LAPAROSCOPIC OVARIAN CYSTECTOMY (Bilateral) - With excision of pelvic endometriosis DILATATION AND CURETTAGE /HYSTEROSCOPY (N/A) POLYPECTOMY (N/A)  Patient Location: PACU  Anesthesia Type:General  Level of Consciousness: sedated  Airway & Oxygen Therapy: Patient Spontanous Breathing and Patient connected to nasal cannula oxygen  Post-op Assessment: Report given to PACU RN  Post vital signs: Reviewed and stable  Complications: No apparent anesthesia complications

## 2012-10-05 NOTE — OR Nursing (Signed)
1330- attempted to ambulate pt to phase 2. Pt states dizzy and nauseaus, pt iv fluids hooked up and pt lay back down on stretcher. Or received for iv reglan. Pt family given update. Pulse 88, sats 98 on room air, blood pressure 122/68.

## 2012-10-05 NOTE — Brief Op Note (Signed)
10/05/2012  11:36 AM  PATIENT:  Claudia Snyder  26 y.o. 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, endometrial polyp  PROCEDURE:  DaVinci Robotic assisted bilateral ovarian cystectomy, excision of pelvic endometriosis, diagnostic hysteroscopy, removal of endometrial polyp, dilation and currettage  SURGEON:  Surgeon(s) and Role:    * Rhea Kaelin A Zabella Wease, MD - Primary  PHYSICIAN ASSISTANT:   ASSISTANTS: Marlinda Mike, CNM   ANESTHESIA:   general Findings: large right ovarian dermoid cysts x 2, left ovarian dermoid cyst, both tubal ostia seen, endometrial polyp, pelvic endometriosis left post cul de sac, right round ligament shortened and adherent to right side of uterus, right ant bladder peritoneum with adhesion due to endometriosis and to LUS, ? 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), periappendiceal adhesions, nl liver edge  EBL:  Total I/O In: 2000 [I.V.:2000] Out: 175 [Urine:100; Blood:75]  BLOOD ADMINISTERED:none  DRAINS: none   LOCAL MEDICATIONS USED:  MARCAINE    and BUPIVICAINE   SPECIMEN:  Source of Specimen:  ovarian cyst wall( right and left), round ligament endometriosis, posterior culd de sac endometriosis, endometrial poly with EMC  DISPOSITION OF SPECIMEN:  PATHOLOGY  COUNTS:  YES  TOURNIQUET:  * No tourniquets in log *  DICTATION: .Other Dictation: Dictation Number   PLAN OF CARE: Discharge to home after PACU  PATIENT DISPOSITION:  PACU - hemodynamically stable.   Delay start of Pharmacological VTE agent (>24hrs) due to surgical blood loss or risk of bleeding: no

## 2012-10-05 NOTE — Anesthesia Preprocedure Evaluation (Addendum)
Anesthesia Evaluation  Patient identified by MRN, date of birth, ID band Patient awake    Reviewed: Allergy & Precautions, H&P , NPO status , Patient's Chart, lab work & pertinent test results  Airway Mallampati: I TM Distance: >3 FB Neck ROM: full    Dental No notable dental hx. (+) Teeth Intact   Pulmonary neg pulmonary ROS,  breath sounds clear to auscultation  Pulmonary exam normal       Cardiovascular negative cardio ROS  + Systolic murmurs    Neuro/Psych negative psych ROS   GI/Hepatic negative GI ROS, Neg liver ROS,   Endo/Other  negative endocrine ROS  Renal/GU negative Renal ROS  negative genitourinary   Musculoskeletal negative musculoskeletal ROS (+)   Abdominal Normal abdominal exam  (+)   Peds negative pediatric ROS (+)  Hematology negative hematology ROS (+)   Anesthesia Other Findings   Reproductive/Obstetrics negative OB ROS                           Anesthesia Physical Anesthesia Plan  ASA: I  Anesthesia Plan: General   Post-op Pain Management:    Induction: Intravenous  Airway Management Planned: Oral ETT  Additional Equipment:   Intra-op Plan:   Post-operative Plan: Extubation in OR  Informed Consent: I have reviewed the patients History and Physical, chart, labs and discussed the procedure including the risks, benefits and alternatives for the proposed anesthesia with the patient or authorized representative who has indicated his/her understanding and acceptance.   Dental Advisory Given  Plan Discussed with: CRNA and Surgeon  Anesthesia Plan Comments:         Anesthesia Quick Evaluation

## 2012-10-06 ENCOUNTER — Encounter (HOSPITAL_COMMUNITY): Payer: Self-pay | Admitting: Obstetrics and Gynecology

## 2012-10-06 NOTE — Op Note (Signed)
NAME:  Claudia Snyder, Claudia Snyder    ACCOUNT NO.:  [id number]  MEDICAL RECORD NO.:  [id number]  LOCATION:  [facility]                          FACILITY:  [facility]  PHYSICIAN:  Maxie Better, M.D.DATE OF BIRTH:  1985-11-05  DATE OF PROCEDURE:  10/05/2012 DATE OF DISCHARGE:  10/05/2012                              OPERATIVE REPORT   PREOPERATIVE DIAGNOSES:  Bilateral ovarian dermoid cyst, menorrhagia, dysmenorrhea.  PROCEDURES:  Da Vinci robotic-assisted bilateral ovarian cystectomy, excision/resection of pelvic endometriosis, diagnostic hysteroscopy,removal of endometrial mass, dilation and curettage.  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  ANESTHESIA:  General.  SURGEON:  Maxie Better, M.D.  ASSISTANT:  Marlinda Mike, C.N.M.  PROCEDURE:  Under adequate general anesthesia, the patient was placed in the dorsal lithotomy position.  She was positioned for the robotic surgery.  Examination under anesthesia revealed an anteflexed small uterus with no defined palpable adnexal masses.  The patient was then sterilely prepped and draped in usual fashion and indwelling Foley catheter was sterilely placed.  A bivalve speculum was placed in the vagina.  Single-tooth tenaculum was placed on the anterior lip of the cervix.  The cervix was gently dilated.  The uterus was sounded to 7 cm. A diagnostic hysteroscope was then introduced into the uterine cavity. Polypoid lesion was noted in the right posterior wall.  Both tubal ostia could be seen.  Going through the hysteroscope, a grasper was then used to grasp the endometrial mass and removed with the diagnostic hysteroscope.  The hysteroscope was then reinserted, no other lesions were noted.  The hysteroscope was then removed.  The cavity was then gently curetted.  The hysteroscope was then reinserted.  No other lesions were noted and the procedure was  completed.  At that point, a #6 uterine manipulator was introduced into the uterine cavity and the intracervical balloon was insufflated.  Attention was then turned to the abdomen.  Due to the patient's shortened trunk, decision was then made to insufflate the abdomen prior to delineating the location of the robotic ports sites.  Through the umbilicus, a small incision was made in the umbilicus.  The Veress needle was introduced, opening pressure of 6 was noted.  A 3 L of CO2 was insufflated, allowing for good placement of the robotic ports.  The patient had a tattoo in her left lower quadrant. However, using the marking pen to determine best location allowed for the third arm port site to be place.  A supraumbilical site incision was then made after 0.25% Marcaine was then utilized.  The marking pen was then used to  mark port placement 8 cm away from that central location of the robotic camera port site and two 8-mm sites were placed on the left, one 8-mm robotic port site was placed on the right and in the right lower quadrant, a 10- mm assistant port was then utilized.  A 12-mm port was then placed through the supraumbilical sites.  The robotic camera was then placed through that port.  Entry into the abdomen was without any injury. Fluid from the hysteroscope was noted and there was a large mass floating posterior and superior to the uterus consistent with the patient's ovary.  The upper abdomen  was notable for normal liver edge, gallbladder was seen.  The appendix was noted to be in containing some adhesions.  At that point, the 8-mm robotic port sites were placed, two on the left, one on the right, and the right lower quadrant 10-mm assistant port was placed.  The robot was then brought to the patient's left side and docked.  #1 arm had monopolar scissors placement.  #2 arm had the PK dissector placement, and  #3 arm  had the Prograsp placement. Once these were done, I then went to  the surgical console.  At the surgical console, first step was having the assistant aspirate all the glycine fluid from the pelvis.  The uterus was then brought anteriorly and the pelvis was inspected.  A large right ovarian cystic mass was noted, which was attached posteriorly and laterally to the pelvic sidewall.  The round ligament on the right was shortened with endometriotic implant, tightening that round ligament.  The bladder was pulled up on the uterus particular on the patient's right side with endometriotic evidence of endometrial implants and scarring.  On the left, the left ovary was enlarged with cystic mass as well.  Left tube was normal.  Posteriorly, there was dark and puckered  lesions in the left posterior cul-de-sac consistent with endometriotic implant and in the right  superolateral to the uterosacral ligament, endometriosis was also noted.  The appendix was twisted on itself with periappendiceal adhesions.  The procedure was started by releasing the right ovary from its adhesions.  The anterior cul-de-sac endometriosis and scarring with the bladder was bluntly dissected off the anterior aspect of the uterus followed by opening the peritoneum laterally around the round ligament and also at that point, dissecting off the endometriotic implants including the round ligament and taking that entire endometriosis off and removing, freeing up the ovary.  The third arm was then used to suspend the ovary, allowing for endobag to be placed through the assistant Port and under the ovary. A transverse incision was then made on the antimesenteric border of the ovary, large amount of fluid was then released.  The capsule was then further opened and using the third arm, the opened cystic wall was further explored with evidence of hair and sebaceous material.  The cyst wall was then dissected off resulted in another separate ovarian mass being noted.  Once all of these cystic wall  from the original ruptured cyst was removed, the bag was able to be brought out with the fluid being aspirated and the tissue removed.  A second ovarian cyst was then also opened in that right ovary and sebaceous materials and hair were also encountered, the cyst wall was then removed as well with a second endobag being placed below.  The fluid was suctioned out of the second ovarian cyst prior to removal of the cyst wall itself.  Once this was done and all areas of cystic wall was removed, hemostasis was achieved by carefully cauterizing the bleeding sites.  Attention was then turned to the remaining endometriotic implants and the left ovarian cyst.  Posteriorly, sharp dissection was then performed, resulting in the posterior cul-de-sac endometriotic implants and Allen-Masters windows being resected.  After that was resected, it was then noted that it was aberrant course of the left ureter really in the posterior cul-de-sac more so than on the left lateral wall and clearly peristalsing throughout.  The left sidewall was then inspected and no other evidence of ureter identified.  The endometriosis on  the posterior aspect of the uterus was also carefully removed.  Once all the endometriotic implants were removed, the left tube and ovary was then suspended with the third arm.  A third endobag was then placed.  The ovarian serosa was then opened.  Copious amount of sebaceous material and hair was removed.  The cyst wall was further opened and the cyst wall removed on that left ovary as well.  No other ovarian cystic mass was noted and the fluid in the ovarian cyst was also then removed to that endobag.  The abdomen was then in between.  The removal of the ovarian cyst fluid that had spilled over was suctioned.  The pelvic cavity was  Copiously irrigated and suctioned out. The left ovarian cyst wall defect area was then cauterized with small bleeders.  The pelvis was then copiously irrigated.   As the third bag was being removed, attention was then turned to the appendix, which had appendiceal adhesion and no defined evidence of endometriosis on that appendix.  Nonetheless, the surrounding adhesions that had the appendix twisted was carefully released without any cautery and the appendix was left alone.  The pelvis once again was irrigated and suctioned.  The pelvis was discontinued to irrigate until the oiliness from the ovarian cyst was no longer floating and present.  At that point after good irrigation and suction, the previous endometriotic implant sites were reinspected.  Additional removal of endometriotic lesions was continued. Good hemostasis subsequently noted.  No other evidence of endometriosis present.  The procedure was felt to be completed.  Interceed was then placed anteriorly, posteriorly and over both ovaries as well as Surgicel over the uterus itself.  At that point, the robotic instruments were removed, the robot was undocked.  I then left the surgical console, went back to the patient's bedside, and further inspection was then performed.  Upper abdominal fluid was suctioned.  With good hemostasis achieved, the robotic ports sites were then removed.  The right lower quadrant and the supraumbilical port sites had fascial stitch placed with 0 Vicryl figure-of-eight sutures and the skin approximated with subcuticular 4-0 Vicryl sutures.  The manipulating instrument in the vagina was also removed.  SPECIMEN:  Right and left ovarian cyst wall.  Endometriotic resection including the round ligament peritoneum resection, the left posterior cul-de-sac endometriotic implant resection, and endometrial curetting with endometrial polyps all sent to Pathology.  ESTIMATED BLOOD LOSS:  75 mL.  INTERRUPTED FLUID:  2.5 liters.  URINE OUTPUT:  100 mL concentrated urine.  COUNTS:  Sponge and instrument counts x2 was correct.  COMPLICATION:  None.  The patient  tolerated the procedure very well, was transferred to the recovery room in stable condition.     Maxie Better, M.D.     [place]/MEDQ  D:  10/05/2012  T:  10/06/2012  Job:  [id number]

## 2012-10-14 IMAGING — CR DG CHEST 1V PORT
1 series · 1 of 1 positions shown · non-contrast
Comparison: Chest radiograph performed 08/25/2011

CLINICAL DATA: Follow up pneumothorax.  Chest pain.

PORTABLE CHEST - 1 VIEW

[AP]
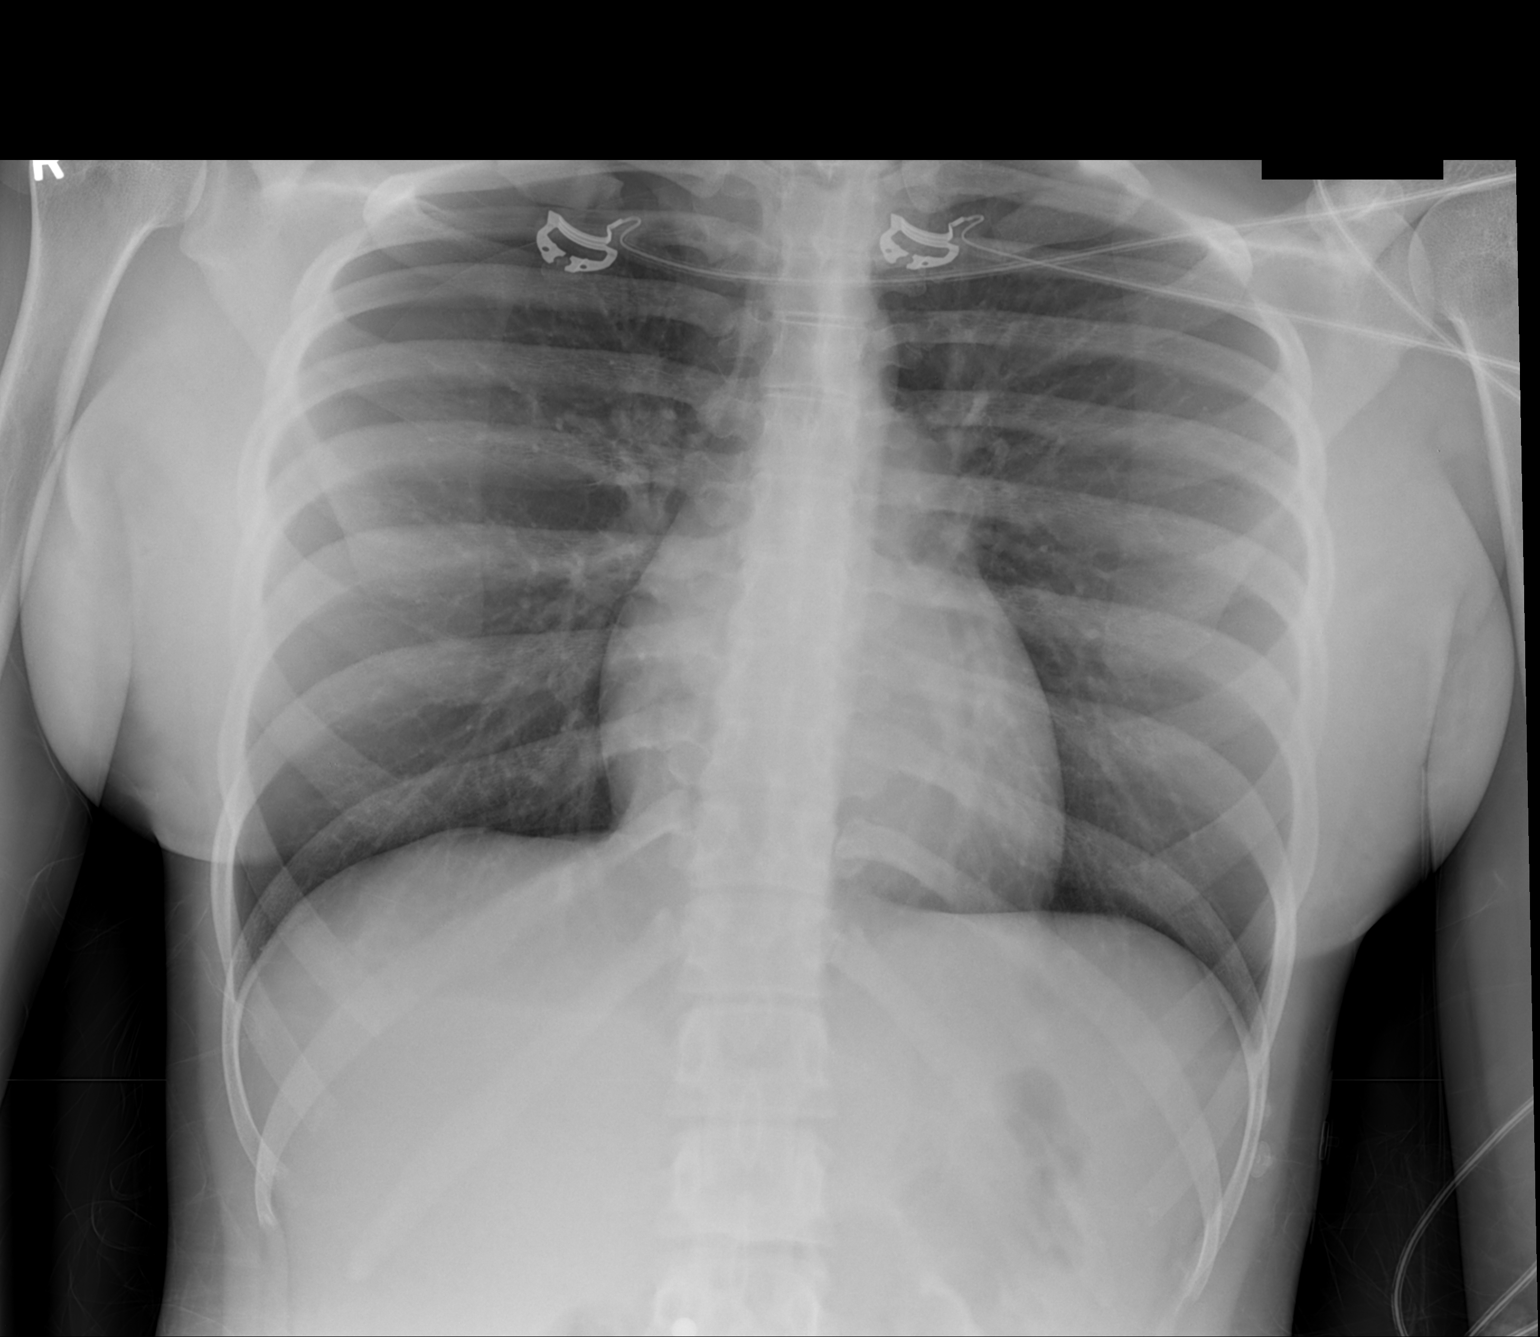

[1 of 1 positions shown; findings below may reference images not displayed]

FINDINGS: There is a persistent small right-sided pneumothorax,
which appears to have decreased slightly in size from the prior
study.  On quantitative calculation, this measures approximately
20% of right lung volume.

The left lung appears clear.  No pleural effusion or focal
consolidation is seen.

The cardiomediastinal silhouette remains normal in size.  No acute
osseous abnormalities are appreciated.
IMPRESSION: Persistent small right-sided pneumothorax, which appears to have
decreased slightly in size from the prior study.  On quantitative
calculation, this measures approximately 20% of right lung volume;
this size of pneumothorax will likely resorb over time.

## 2012-10-14 IMAGING — CR DG CHEST 1V PORT
1 series · 1 of 1 positions shown · non-contrast
Comparison: Earlier today at 8424 hours.

CLINICAL DATA: Evaluate pneumothorax.

PORTABLE CHEST - 1 VIEW

[AP]
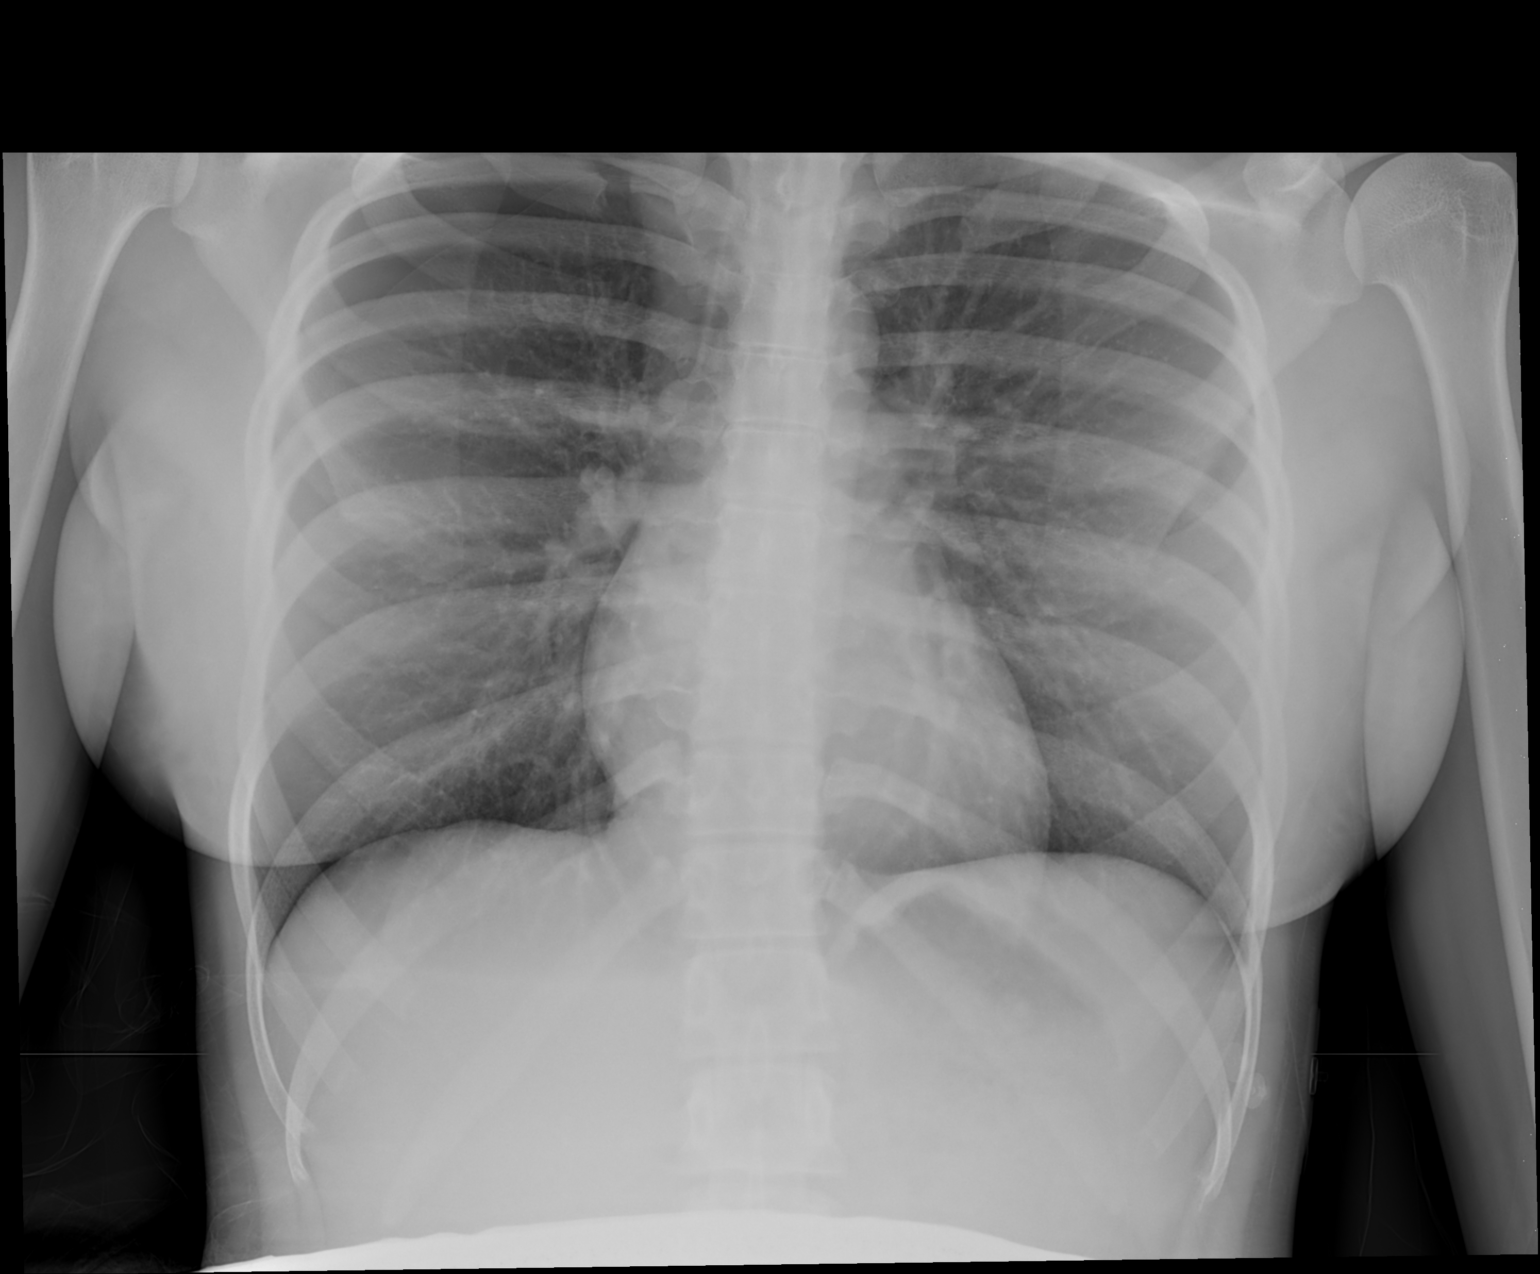

[1 of 1 positions shown; findings below may reference images not displayed]

FINDINGS: Midline trachea.  Normal heart size and mediastinal
contours.  No pleural fluid.  Right apical pneumothorax is similar,
2.9 cm from the chest wall.  Approximately 15%.  No mediastinal
shift.  Lungs clear.
IMPRESSION: No change in approximately 15% right apical pneumothorax.

## 2012-10-15 IMAGING — CR DG CHEST 1V PORT
1 series · 1 of 1 positions shown · non-contrast
Comparison: 08/26/2011

CLINICAL DATA: Follow up pneumothorax

PORTABLE CHEST - 1 VIEW

[AP]
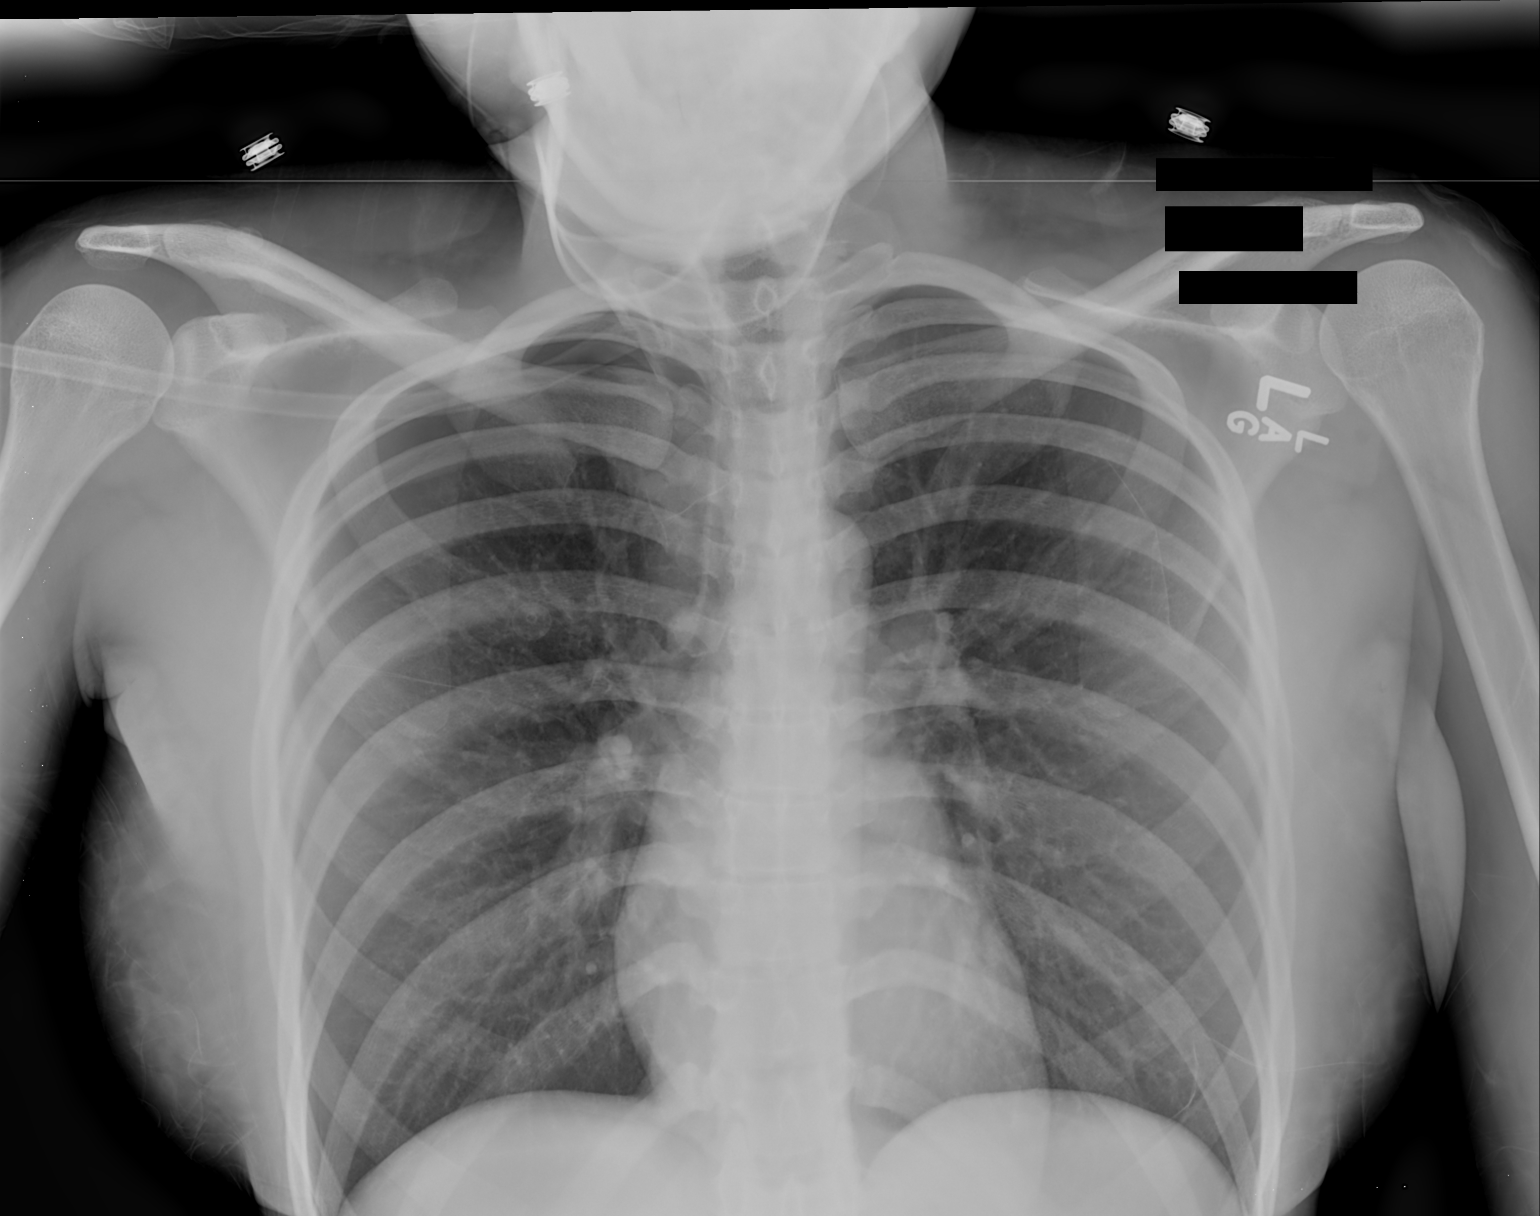

[1 of 1 positions shown; findings below may reference images not displayed]

FINDINGS: Right pneumothorax is slightly smaller.  No significant
pleural effusion.  Lungs are clear without infiltrate or heart
failure.
IMPRESSION: Right apical pneumothorax, approximately 10-15% shows interval
improvement.

## 2012-10-23 IMAGING — CR DG CHEST 2V
2 series · 2 of 2 positions shown · non-contrast
Comparison: 08/28/2011

CLINICAL DATA: Follow up right pneumothorax, former smoker

CHEST - 2 VIEW

[view not recorded (1 of 2)]
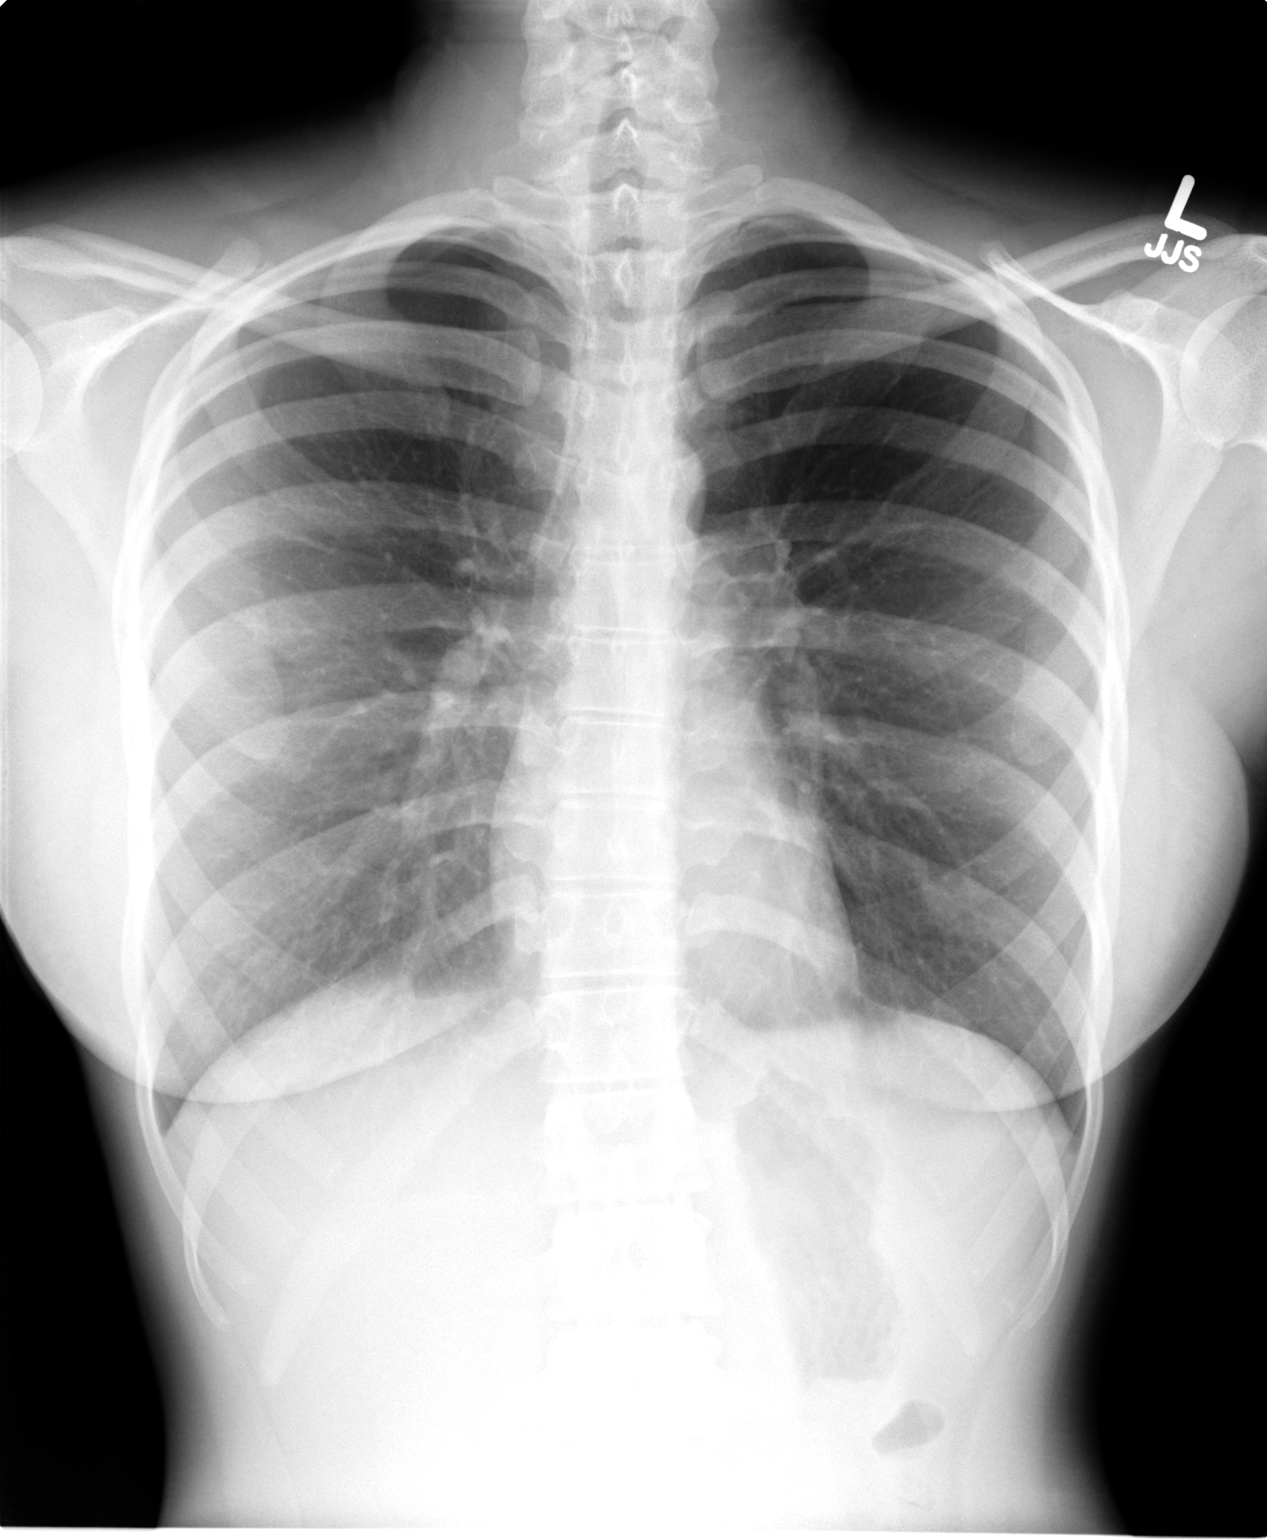

[view not recorded (2 of 2)]
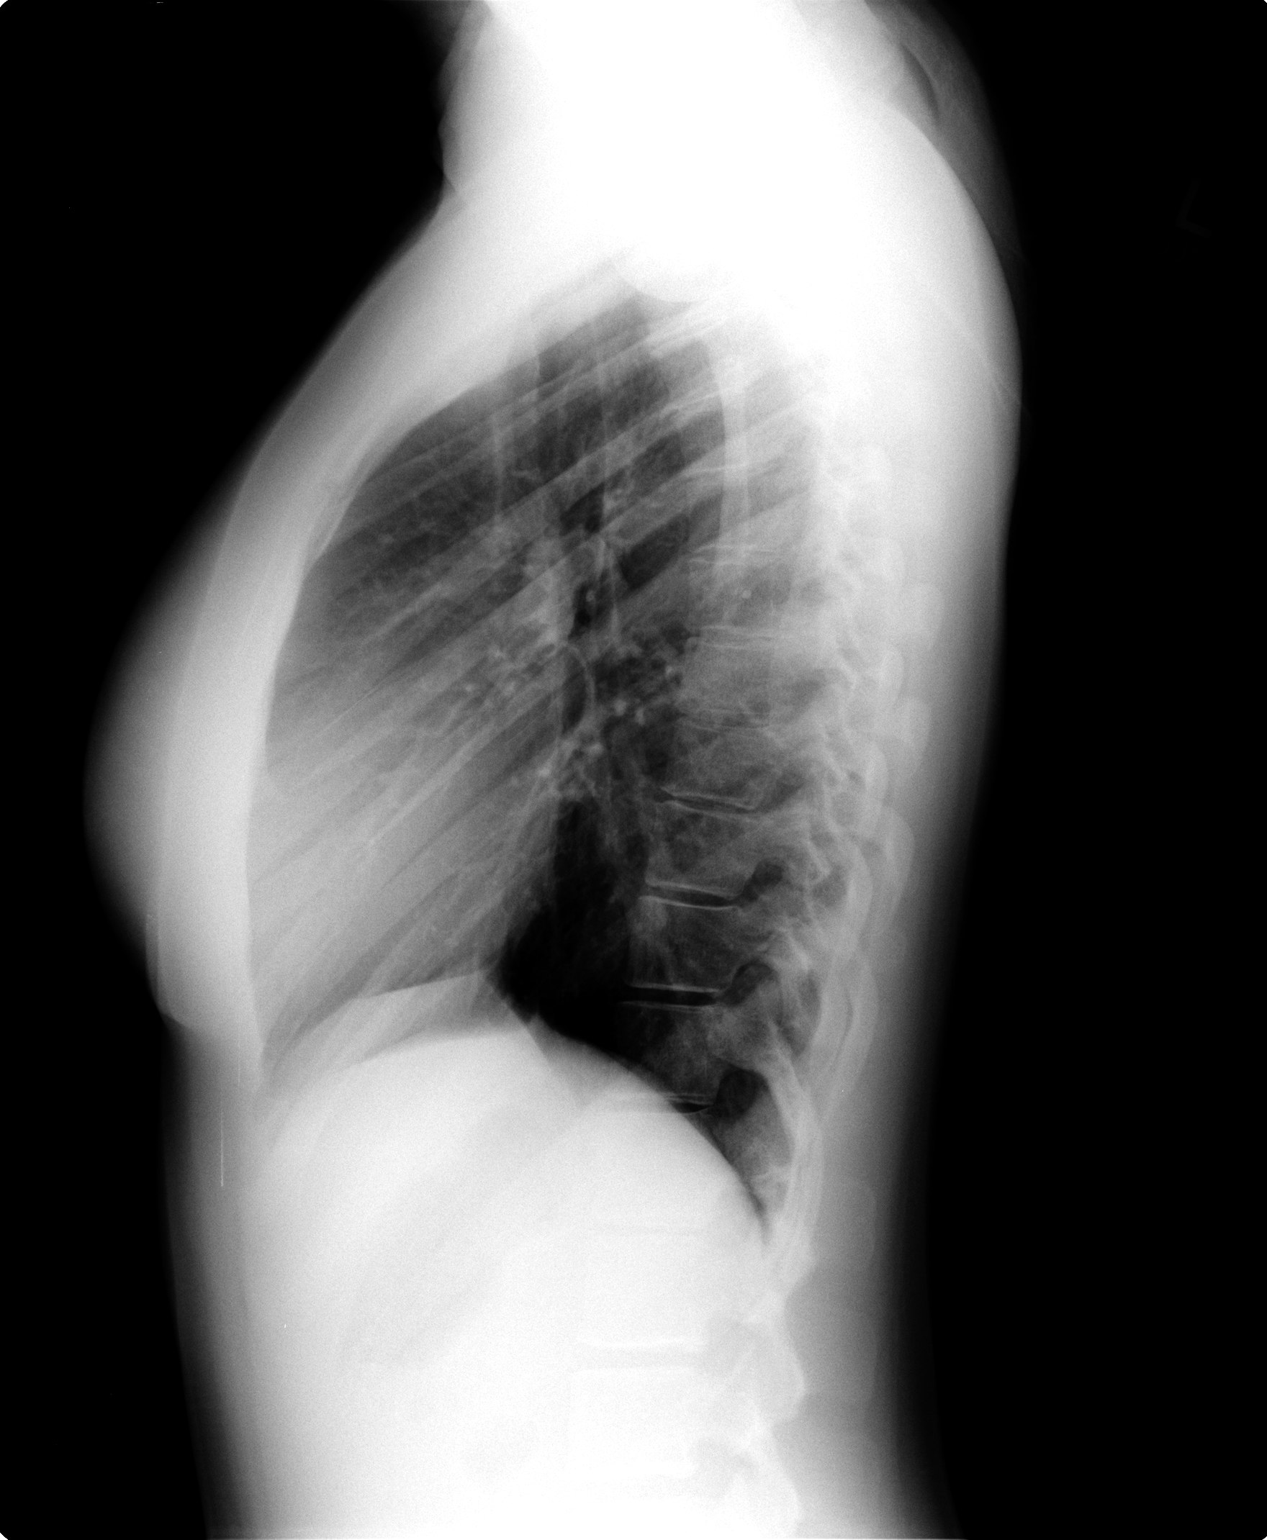

[2 of 2 positions shown; findings below may reference images not displayed]

FINDINGS: Persistent right apical pneumothorax, mildly decreased since
previous exam.
Heart size, mediastinal contours, pulmonary vascularity normal.
Remaining lungs clear.
No pleural effusion or focal bony abnormality.
IMPRESSION: Mild decrease in size of previously seen right apical pneumothorax.

## 2013-04-16 ENCOUNTER — Ambulatory Visit: Payer: BC Managed Care – PPO

## 2013-04-16 ENCOUNTER — Ambulatory Visit (INDEPENDENT_AMBULATORY_CARE_PROVIDER_SITE_OTHER): Payer: BC Managed Care – PPO | Admitting: Internal Medicine

## 2013-04-16 VITALS — BP 112/65 | HR 76 | Temp 98.1°F | Resp 16 | Ht 64.5 in | Wt 122.2 lb

## 2013-04-16 DIAGNOSIS — R079 Chest pain, unspecified: Secondary | ICD-10-CM

## 2013-04-16 DIAGNOSIS — M940 Chondrocostal junction syndrome [Tietze]: Secondary | ICD-10-CM

## 2013-04-16 DIAGNOSIS — J939 Pneumothorax, unspecified: Secondary | ICD-10-CM

## 2013-04-16 DIAGNOSIS — J9383 Other pneumothorax: Secondary | ICD-10-CM

## 2013-04-16 MED ORDER — MELOXICAM 15 MG PO TABS: 15.0000 mg | ORAL_TABLET | Freq: Every day | ORAL | Status: DC

## 2013-04-16 NOTE — Patient Instructions (Addendum)
You have been diagnosed with costochondritis. This is an inflammation of the cartilage that connects a rib to the breastbone - a junction known as the costosternal joint. Pain caused by costochondritis may mimic that of a heart attack or other heart conditions. Most cases of costochondritis have no apparent cause. In these cases, treatment focuses on easing your pain while you wait for costochondritis to improve on its own. Costochondritis usually goes away on its own, although in some cases it may last for several months or longer. Treatment focuses on pain relief. Try placing hot compresses or a heating pad to the painful area several times a day. Keep the heat on a low setting. Ice also may be helpful. Avoid activities that make your pain worse. You have been prescribed Meloxicam 15mg  tablets. Take one a day to help with pain relief. Also do chest wall exercises.   Chest wall exercises: Use The Door A doorway chest stretch alleviates tension in chest muscles causing rounded shoulders and slumping. Do this stretch standing up tall in a doorway with shoulders relaxed and underneath the ears. Place feet shoulder-width apart with your right foot slightly forward of your left. Bend your elbows to 90 degrees and place each forearm and wrist on each side of the doorway. Lean forward until you feel a stretch in the shoulders and stretch and hold for 30 seconds. Switch legs position and repeat with the left foot slightly in front. Retract The Shoulder Blades The scapulas, or shoulder blades, often protract or rotate forward with poor posture. Strengthen back muscles, rhomboids, trapezius and lats to help bring the shoulders back into a neutral position and decrease tension on chest musculature. Do scapular retraction exercises by standing tall with shoulders under the ears. Bend your elbows to 90 degrees with palms facing inwards. Gently squeeze the shoulder blades together as you move your elbows straight back as  though they were sliding on a pane of glass. Avoid hiking up the shoulders. Repeat 10 times for a total of three sets. Hold It Up Plank exercises target the deep abdominal muscles to cinch in your belly for improved posture overall. Toned abs will decrease stress placed on the lower and upper back that contributes to a forward lean, rounded shoulders and a tight chest. Do this exercise by lying on the floor face-down. Lift yourself up on your toes and forearms with elbows underneath your shoulders. Contract your abs to maintain a straight line from head to toe and hold for 30 seconds to one minute. Repeat three times. If you have difficulty on your toes or feel your chest caving in, switch to resting on your knees and progress as the exercise gets easier. Stretch Those Hammys Tight hamstrings, the muscles on the back of the thighs, can lead to a posterior pelvic tilt in which the pelvis rotates backwards. This decreases the natural spinal curvature leading to excessive rounding of the upper back and increased tension on the pectoral muscles. Release tight hamstrings with a half-sitting stretch. Sit on a firm bed or bench with your left leg straight out in front of you and the right leg hanging off the edge so your foot is flat on the floor. Keep your back straight as you bend forward from the hips to reach your hand towards your toes. Avoid rounding the back or pushing into pain. Hold the stretch for 20 seconds before switching to repeat on the right leg.

## 2013-04-16 NOTE — Progress Notes (Signed)
Subjective:    Patient ID: Claudia Snyder, female    DOB: [birth year], 27 y.o.   MRN: [id number]  Patient presents today with complaints of chest pain since 8:30am. Onset was sudden, right anterior chest, precipitated by movement or deep breathing. Pain is a squeezing sensation. Patient states that she has had chest pain in the past, but that is one is different. Pain is worse with movement. Pain is at a level 6/10. Patient has not tried anything to relieve the pain, but is relieved by rest. Pain is also in her upper back in her scapula area.  Pain does not move or radiate. Has history of spontaneous pneumothorax 2 years ago. No palpitations. No shortness of breath at rest. No fever or cough. No history of recent illness.  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 The current episode started today. The onset quality is sudden. The problem occurs constantly. The problem has been unchanged. The pain is at a severity of 6/10. The pain is moderate. The quality of the pain is described as dull (pain is 'squeezing' with movement). The pain does not radiate. Associated symptoms include back pain, headaches, malaise/fatigue (patient is currently in school at [employer], just recieved a masters in Public relations account executive) and weakness. Pertinent negatives include no abdominal pain, claudication, cough, diaphoresis, dizziness, fever, leg pain, lower extremity edema, nausea, near-syncope, numbness, palpitations, shortness of breath, syncope or vomiting. The pain is aggravated by breathing, exertion, lifting arm and movement. She has tried nothing for the symptoms. Risk factors: history of pneumothrox October 2012.  Her past medical history is significant for anxiety/panic attacks.  Pertinent negatives for family medical history include: no CAD in family and no early MI in family.      Review of Systems  Constitutional: Positive for malaise/fatigue (patient is currently  in school at [employer], just recieved a masters in Public relations account executive). Negative for fever, chills and diaphoresis.  HENT: Negative for congestion, neck pain, neck stiffness and sinus pressure.   Respiratory: Negative for cough and shortness of breath.   Cardiovascular: Positive for chest pain. Negative for palpitations, claudication, syncope and near-syncope.  Gastrointestinal: Negative for nausea, vomiting and abdominal pain.  Endocrine: Negative.   Genitourinary: Negative.   Musculoskeletal: Positive for back pain. Negative for myalgias and joint swelling.  Skin: Negative for color change and pallor.  Allergic/Immunologic: Negative.   Neurological: Positive for weakness and headaches. Negative for dizziness, tremors, syncope, speech difficulty, light-headedness and numbness.  Hematological: Negative.   Psychiatric/Behavioral: Negative.        Objective:   Physical Exam  Constitutional: She is oriented to person, place, and time. She appears well-developed and well-nourished. No distress.  HENT:  Head: Normocephalic and atraumatic.  Mouth/Throat: Oropharynx is clear and moist.  Eyes: Pupils are equal, round, and reactive to light.  Neck: Normal range of motion. Neck supple.  Cardiovascular: Normal rate, regular rhythm, intact distal pulses and normal pulses.  PMI is not displaced.  Exam reveals no gallop and no friction rub.   Murmur heard. Pulmonary/Chest: Effort normal and breath sounds normal. No accessory muscle usage. Not tachypneic. No respiratory distress. She has no wheezes. She has no rhonchi. She has no rales. She exhibits no tenderness.    Abdominal: Soft. Bowel sounds are normal. She exhibits no distension. There is no tenderness.  Musculoskeletal:  ROM limited due to pain  Neurological: She is alert and oriented to person, place, and time.  Skin: Skin is warm  and dry. She is not diaphoretic.  Psychiatric: She has a normal mood and affect. Her behavior is normal.   EKG within  normal limits. . CXR per rad 5%pnuem on R as before-?new      Assessment & Plan:   Assessment- Costochondritis                      - Pneumothorax minimal           - Flow murmur  Plan- Meloxicam 15mg  tablet po QD for chest pain        - Education provided to do chest wall exercises        - Follow up in one week to repeat chest xray        - Follow up with worsening symptoms           **Phone call placed to patient after receiving radiologist cxr reading. Patient notified of 5% pneumothorax. Understanding verbalized. Patient to return to clinic for repeat cxr in one week.

## 2013-04-17 ENCOUNTER — Encounter: Payer: Self-pay | Admitting: Internal Medicine

## 2013-04-23 ENCOUNTER — Ambulatory Visit: Payer: BC Managed Care – PPO

## 2013-04-23 ENCOUNTER — Ambulatory Visit (INDEPENDENT_AMBULATORY_CARE_PROVIDER_SITE_OTHER): Payer: BC Managed Care – PPO | Admitting: Internal Medicine

## 2013-04-23 VITALS — BP 103/65 | HR 68 | Temp 99.0°F | Resp 16 | Ht 64.0 in | Wt 122.0 lb

## 2013-04-23 DIAGNOSIS — J9383 Other pneumothorax: Secondary | ICD-10-CM

## 2013-04-23 NOTE — Progress Notes (Signed)
  Subjective:    Patient ID: Claudia Snyder, female    DOB: 1985/12/12, 27 y.o.   MRN: [id number]  HPI followup for chest pain/pneumothorax noted as recurrence at last visit Symptoms gradually improved/feels fine today    Review of Systems     Objective:   Physical Exam BP 103/65  Pulse 68  Temp(Src) 99 F (37.2 C) (Oral)  Resp 16  Ht 5\' 4"  (1.626 m)  Wt 122 lb (55.339 kg)  BMI 20.93 kg/m2  SpO2 100%  LMP 04/10/2013 No acute distress Chest clear to auscultation Heart regular without murmur  [facility] reading (PRIMARY) by  Dr. 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

## 2015-09-03 ENCOUNTER — Ambulatory Visit
Admission: EM | Admit: 2015-09-03 | Discharge: 2015-09-03 | Disposition: A | Discharge status: Home / Self Care | Payer: Federal, State, Local not specified - PPO | Attending: Family Medicine | Admitting: Family Medicine

## 2015-09-03 ENCOUNTER — Encounter: Payer: Self-pay | Admitting: Emergency Medicine

## 2015-09-03 DIAGNOSIS — B029 Zoster without complications: Secondary | ICD-10-CM

## 2015-09-03 HISTORY — DX: Endometriosis, unspecified: N80.9

## 2015-09-03 MED ORDER — VALACYCLOVIR HCL 1 G PO TABS: 1000.0000 mg | ORAL_TABLET | Freq: Three times a day (TID) | ORAL | Status: AC

## 2015-09-03 MED ORDER — HYDROCODONE-ACETAMINOPHEN 5-325 MG PO TABS: 1.0000 | ORAL_TABLET | Freq: Three times a day (TID) | ORAL | Status: DC | PRN

## 2015-09-03 NOTE — ED Notes (Signed)
Patient states she developed a rash on her back yesterday, is having a lot of pain with the rash.

## 2015-09-03 NOTE — ED Provider Notes (Signed)
CSN: [id number]     Arrival date & time 09/03/15  4709 History   First MD Initiated Contact with Patient 09/03/15 [phone]    Nurses notes were reviewed.  Chief Complaint  Patient presents with  . Rash   (Consider location/radiation/quality/duration/timing/severity/associated sxs/prior Treatment) Patient is a 29 y.o. female presenting with rash. The history is provided by the patient.  Rash Location:  Torso Torso rash location:  L flank and lower back Quality: blistering and painful   Quality: not burning and not itchy   Pain details:    Quality:  Aching and burning   Onset quality:  Sudden Severity:  Moderate Onset quality:  Sudden Duration:  3 days Progression:  Improving Chronicity:  New Context: not animal contact, not chemical exposure, not diapers, not hot tub use, not plant contact and not pollen   Relieved by:  Nothing Worsened by:  Nothing tried Associated symptoms: no abdominal pain, no diarrhea, no fatigue, no fever, no myalgias, no shortness of breath and no sore throat     Past Medical History  Diagnosis Date  . Spontaneous pneumothorax   . Heart murmur   . Headache(784.0)   . Anemia   . PONV (postoperative nausea and vomiting)   . Shortness of breath 2012    Pneumothorax  . Endometriosis    Past Surgical History  Procedure Laterality Date  . Robotic assisted laparoscopic ovarian cystectomy  10/05/2012    Procedure: ROBOTIC ASSISTED LAPAROSCOPIC OVARIAN CYSTECTOMY;  Surgeon: Marvene Staff, MD;  Location: [facility];  Service: Gynecology;  Laterality: Bilateral;  With excision of pelvic endometriosis  . Hysteroscopy w/d&c  10/05/2012    Procedure: DILATATION AND CURETTAGE /HYSTEROSCOPY;  Surgeon: Marvene Staff, MD;  Location: [facility];  Service: Gynecology;  Laterality: N/A;  . Polypectomy  10/05/2012    Procedure: POLYPECTOMY;  Surgeon: Marvene Staff, MD;  Location: [facility];  Service: Gynecology;  Laterality: N/A;   Family History  Problem Relation  Age of Onset  . Asthma Sister   . Cancer Maternal Grandmother     ? type  . Hypertension Mother   . Anemia Mother   . Hyperlipidemia Father   . Heart murmur Father    Social History  Substance Use Topics  . Smoking status: Never Smoker   . Smokeless tobacco: Never Used     Comment: Pt states used to go to the "hookah bars" ans smoke flavored tobacco  . Alcohol Use: Yes     Comment: occ;1 or 2 x month   OB History    No data available     Review of Systems  Constitutional: Negative for fever and fatigue.  HENT: Negative for sore throat.   Respiratory: Negative for shortness of breath.   Gastrointestinal: Negative for abdominal pain and diarrhea.  Musculoskeletal: Negative for myalgias.  Skin: Positive for rash.    Allergies  Other  Home Medications   Prior to Admission medications   Medication Sig Start Date End Date Taking? Authorizing Provider  medroxyPROGESTERone (DEPO-PROVERA) 150 MG/ML injection Inject 150 mg into the muscle every 3 (three) months.   Yes Historical Provider, MD  calcium-vitamin D (OSCAL WITH D) 500-200 MG-UNIT per tablet Take 1 tablet by mouth 2 (two) times daily.    Historical Provider, MD  ferrous sulfate 325 (65 FE) MG tablet Take 325 mg by mouth daily with breakfast.    Historical Provider, MD  folic acid (FOLVITE) 1 MG tablet Take 1 mg by mouth daily.  Historical Provider, MD  HYDROcodone-acetaminophen (NORCO) 5-325 MG tablet Take 1 tablet by mouth every 8 (eight) hours as needed for moderate pain. 09/03/15   Frederich Cha, MD  HYDROcodone-acetaminophen (VICODIN) 5-500 MG per tablet Take 1 tablet by mouth every 6 (six) hours as needed for pain. 10/05/12   Servando Salina, MD  ibuprofen (ADVIL,MOTRIN) 800 MG tablet Take 1 tablet (800 mg total) by mouth every 8 (eight) hours as needed for pain. 10/05/12   Servando Salina, MD  ISOtretinoin (ACCUTANE) 40 MG capsule Take 40 mg by mouth 2 (two) times daily.    Historical Provider, MD  meloxicam  (MOBIC) 15 MG tablet Take 1 tablet (15 mg total) by mouth daily. 04/16/13   Leandrew Koyanagi, MD  Norethindrone Acet-Ethinyl Est (MICROGESTIN 1.5/30 PO) Take by mouth.    Historical Provider, MD  oxyCODONE-acetaminophen (PERCOCET) 5-325 MG per tablet Take 1 tablet by mouth every 6 (six) hours as needed for pain. For cramps 09/11/11   Wilhelmina Mcardle, MD  Prenatal Vit-Fe Fumarate-FA (PRENATAL MULTIVITAMIN) TABS Take 1 tablet by mouth 2 (two) times daily.    Historical Provider, MD  terconazole (TERAZOL 7) 0.4 % vaginal cream Place 1 applicator vaginally at bedtime.    Historical Provider, MD  valACYclovir (VALTREX) 1000 MG tablet Take 1 tablet (1,000 mg total) by mouth 3 (three) times daily. 09/03/15 09/17/15  Frederich Cha, MD  vitamin B-12 (CYANOCOBALAMIN) 500 MCG tablet Take 500 mcg by mouth 2 (two) times daily.    Historical Provider, MD   Meds Ordered and Administered this Visit  Medications - No data to display  BP 106/77 mmHg  Pulse 96  Temp(Src) 98.2 F (36.8 C) (Oral)  Resp 18  Ht 5\' 3"  (1.6 m)  Wt 141 lb (63.957 kg)  BMI 24.98 kg/m2  SpO2 100% No data found.   Physical Exam  Constitutional: She is oriented to person, place, and time. She appears well-developed and well-nourished.  HENT:  Head: Normocephalic and atraumatic.  Eyes: Pupils are equal, round, and reactive to light.  Musculoskeletal: Normal range of motion.  Neurological: She is alert and oriented to person, place, and time.  Skin: Skin is warm. Rash noted. There is erythema.     Psychiatric: She has a normal mood and affect. Her behavior is normal.  Vitals reviewed.   ED Course  Procedures (including critical care time)  Labs Review Labs Reviewed - No data to display  Imaging Review No results found.   Visual Acuity Review  Right Eye Distance:   Left Eye Distance:   Bilateral Distance:    Right Eye Near:   Left Eye Near:    Bilateral Near:         MDM   1. Shingles outbreak      Patient reported on Valtrex 1 g 3 times a day for 7 days. Norco 5-25 given to patient for pain he is complaining of. Given for today as well.   Frederich Cha, MD 09/03/15 [phone]

## 2015-09-03 NOTE — Discharge Instructions (Signed)
Shingles °Shingles is an infection that causes a painful skin rash and fluid-filled blisters. Shingles is caused by the same virus that causes chickenpox. °Shingles only develops in people who: °· Have had chickenpox. °· Have gotten the chickenpox vaccine. (This is rare.) °The first symptoms of shingles may be itching, tingling, or pain in an area on your skin. A rash will follow in a few days or weeks. The rash is usually on one side of the body in a bandlike or beltlike pattern. Over time, the rash turns into fluid-filled blisters that break open, scab over, and dry up. Medicines may: °· Help you manage pain. °· Help you recover more quickly. °· Help to prevent long-term problems. °HOME CARE °Medicines  °· Take medicines only as told by your doctor. °· Apply an anti-itch or numbing cream to the affected area as told by your doctor. °Blister and Rash Care  °· Take a cool bath or put cool compresses on the area of the rash or blisters as told by your doctor. This may help with pain and itching. °· Keep your rash covered with a loose bandage (dressing). Wear loose-fitting clothing. °· Keep your rash and blisters clean with mild soap and cool water or as told by your doctor. °· Check your rash every day for signs of infection. These include redness, swelling, and pain that lasts or gets worse. °· Do not pick your blisters. °· Do not scratch your rash. °General Instructions  °· Rest as told by your doctor. °· Keep all follow-up visits as told by your doctor. This is important. °· Until your blisters scab over, your infection can cause chickenpox in people who have never had it or been vaccinated against it. To prevent this from happening, avoid touching other people or being around other people, especially: °¨ Babies. °¨ Pregnant women. °¨ Children who have eczema. °¨ Elderly people who have transplants. °¨ People who have chronic illnesses, such as leukemia or AIDS. °GET HELP IF: °· Your pain does not get better with  medicine. °· Your pain does not get better after the rash heals. °· Your rash looks infected. Signs of infection include: °· Redness. °· Swelling. °· Pain that lasts or gets worse. °GET HELP RIGHT AWAY IF: °· The rash is on your face or nose. °· You have pain in your face, pain around your eye area, or loss of feeling on one side of your face. °· You have ear pain or you have ringing in your ear. °· You have loss of taste. °· Your condition gets worse. °  °This information is not intended to replace advice given to you by your health care provider. Make sure you discuss any questions you have with your health care provider. °  °Document Released: 04/06/2008 Document Revised: 11/09/2014 Document Reviewed: 07/31/2014 °Elsevier Interactive Patient Education ©2016 [employer] ° °Neuropathic Pain °Neuropathic pain is pain caused by damage to the nerves that are responsible for certain sensations in your body (sensory nerves). The pain can be caused by damage to:  °· The sensory nerves that send signals to your spinal cord and brain (peripheral nervous system). °· The sensory nerves in your brain or spinal cord (central nervous system). °Neuropathic pain can make you more sensitive to pain. What would be a minor sensation for most people may feel very painful if you have neuropathic pain. This is usually a long-term condition that can be difficult to treat. The type of pain can differ from person to person. It may start   suddenly (acute), or it may develop slowly and last for a long time (chronic). Neuropathic pain may come and go as damaged nerves heal or may stay at the same level for years. It often causes emotional distress, loss of sleep, and a lower quality of life. °CAUSES  °The most common cause of damage to a sensory nerve is diabetes. Many other diseases and conditions can also cause neuropathic pain. Causes of neuropathic pain can be classified as: °· Toxic. Many drugs and chemicals can cause toxic damage. The  most common cause of toxic neuropathic pain is damage from drug treatment for cancer (chemotherapy). °· Metabolic. This type of pain can happen when a disease causes imbalances that damage nerves. Diabetes is the most common of these diseases. Vitamin B deficiency caused by long-term alcohol abuse is another common cause. °· Traumatic. Any injury that cuts, crushes, or stretches a nerve can cause damage and pain. A common example is feeling pain after losing an arm or leg (phantom limb pain). °· Compression-related. If a sensory nerve gets trapped or compressed for a long period of time, the blood supply to the nerve can be cut off. °· Vascular. Many blood vessel diseases can cause neuropathic pain by decreasing blood supply and oxygen to nerves. °· Autoimmune. This type of pain results from diseases in which the body's defense system mistakenly attacks sensory nerves. Examples of autoimmune diseases that can cause neuropathic pain include lupus and multiple sclerosis. °· Infectious. Many types of viral infections can damage sensory nerves and cause pain. Shingles infection is a common cause of this type of pain. °· Inherited. Neuropathic pain can be a symptom of many diseases that are passed down through families (genetic). °SIGNS AND SYMPTOMS  °The main symptom is pain. Neuropathic pain is often described as: °· Burning. °· Shock-like. °· Stinging. °· Hot or cold. °· Itching. °DIAGNOSIS  °No single test can diagnose neuropathic pain. Your health care provider will do a physical exam and ask you about your pain. You may use a pain scale to describe how bad your pain is. You may also have tests to see if you have a high sensitivity to pain and to help find the cause and location of any sensory nerve damage. These tests may include: °· Imaging studies, such as: °¨ X-rays. °¨ CT scan. °¨ MRI. °· Nerve conduction studies to test how well nerve signals travel through your sensory nerves (electrodiagnostic  testing). °· Stimulating your sensory nerves through electrodes on your skin and measuring the response in your spinal cord and brain (somatosensory evoked potentials). °TREATMENT  °Treatment for neuropathic pain may change over time. You may need to try different treatment options or a combination of treatments. Some options include: °· Over-the-counter pain relievers. °· Prescription medicines. Some medicines used to treat other conditions may also help neuropathic pain. These include medicines to: °¨ Control seizures (anticonvulsants). °¨ Relieve depression (antidepressants). °· Prescription-strength pain relievers (narcotics). These are usually used when other pain relievers do not help. °· Transcutaneous nerve stimulation (TENS). This uses electrical currents to block painful nerve signals. The treatment is painless. °· Topical and local anesthetics. These are medicines that numb the nerves. They can be injected as a nerve block or applied to the skin. °· Alternative treatments, such as: °¨ Acupuncture. °¨ Meditation. °¨ Massage. °¨ Physical therapy. °¨ Pain management programs. °¨ Counseling. °HOME CARE INSTRUCTIONS °· Learn as much as you can about your condition. °· Take medicines only as directed by your health   care provider. °· Work closely with all your health care providers to find what works best for you. °· Have a good support system at home. °· Consider joining a chronic pain support group. °SEEK MEDICAL CARE IF: °· Your pain treatments are not helping. °· You are having side effects from your medicines. °· You are struggling with fatigue, mood changes, depression, or anxiety. °  °This information is not intended to replace advice given to you by your health care provider. Make sure you discuss any questions you have with your health care provider. °  °Document Released: 07/16/2004 Document Revised: 11/09/2014 Document Reviewed: 03/29/2014 °Elsevier Interactive Patient Education ©2016 [employer]vier  Inc[employer] ° °

## 2016-12-02 ENCOUNTER — Ambulatory Visit
Admission: EM | Admit: 2016-12-02 | Discharge: 2016-12-02 | Disposition: A | Discharge status: Home / Self Care | Payer: Federal, State, Local not specified - PPO | Attending: Family Medicine | Admitting: Family Medicine

## 2016-12-02 ENCOUNTER — Encounter: Payer: Self-pay | Admitting: *Deleted

## 2016-12-02 ENCOUNTER — Telehealth: Payer: Self-pay | Admitting: Emergency Medicine

## 2016-12-02 ENCOUNTER — Ambulatory Visit
Admission: RE | Admit: 2016-12-02 | Discharge: 2016-12-02 | Disposition: A | Discharge status: Home / Self Care | Payer: Federal, State, Local not specified - PPO | Source: Ambulatory Visit | Attending: Emergency Medicine | Admitting: Emergency Medicine

## 2016-12-02 DIAGNOSIS — N760 Acute vaginitis: Secondary | ICD-10-CM | POA: Diagnosis not present

## 2016-12-02 DIAGNOSIS — B9689 Other specified bacterial agents as the cause of diseases classified elsewhere: Secondary | ICD-10-CM | POA: Diagnosis not present

## 2016-12-02 DIAGNOSIS — B373 Candidiasis of vulva and vagina: Secondary | ICD-10-CM

## 2016-12-02 DIAGNOSIS — Z8742 Personal history of other diseases of the female genital tract: Secondary | ICD-10-CM | POA: Diagnosis not present

## 2016-12-02 DIAGNOSIS — R102 Pelvic and perineal pain: Secondary | ICD-10-CM | POA: Diagnosis not present

## 2016-12-02 DIAGNOSIS — R1032 Left lower quadrant pain: Secondary | ICD-10-CM | POA: Diagnosis present

## 2016-12-02 DIAGNOSIS — B3731 Acute candidiasis of vulva and vagina: Secondary | ICD-10-CM

## 2016-12-02 LAB — BASIC METABOLIC PANEL
Anion gap: 9 (ref 5–15)
BUN: 7 mg/dL (ref 6–20)
CO2: 27 mmol/L (ref 22–32)
CREATININE: 0.83 mg/dL (ref 0.44–1.00)
Calcium: 9.2 mg/dL (ref 8.9–10.3)
Chloride: 100 mmol/L — ABNORMAL LOW (ref 101–111)
GFR calc Af Amer: >60 mL/min (ref >60)
GLUCOSE: 87 mg/dL (ref 65–99)
Potassium: 3.4 mmol/L — ABNORMAL LOW (ref 3.5–5.1)
Sodium: 136 mmol/L (ref 135–145)

## 2016-12-02 LAB — URINALYSIS, COMPLETE (UACMP) WITH MICROSCOPIC
BILIRUBIN URINE: NEGATIVE
GLUCOSE, UA: NEGATIVE mg/dL
Ketones, ur: NEGATIVE mg/dL
LEUKOCYTES UA: NEGATIVE
NITRITE: NEGATIVE
PH: 7 (ref 5.0–8.0)
Protein, ur: NEGATIVE mg/dL
SPECIFIC GRAVITY, URINE: 1.02 (ref 1.005–1.030)

## 2016-12-02 LAB — WET PREP, GENITAL
SPERM: NONE SEEN
Trich, Wet Prep: NONE SEEN

## 2016-12-02 LAB — CBC WITH DIFFERENTIAL/PLATELET
BASOS ABS: 0.1 10*3/uL (ref 0–0.1)
BASOS PCT: 1 %
EOS PCT: 1 %
Eosinophils Absolute: 0.1 10*3/uL (ref 0–0.7)
HEMATOCRIT: 41 % (ref 35.0–47.0)
Hemoglobin: 13.4 g/dL (ref 12.0–16.0)
Lymphocytes Relative: 35 %
Lymphs Abs: 2.4 10*3/uL (ref 1.0–3.6)
MCH: 25.7 pg — ABNORMAL LOW (ref 26.0–34.0)
MCHC: 32.7 g/dL (ref 32.0–36.0)
MCV: 78.8 fL — AB (ref 80.0–100.0)
MONO ABS: 0.6 10*3/uL (ref 0.2–0.9)
MONOS PCT: 9 %
NEUTROS ABS: 3.8 10*3/uL (ref 1.4–6.5)
Neutrophils Relative %: 54 %
PLATELETS: 291 10*3/uL (ref 150–440)
RBC: 5.2 MIL/uL (ref 3.80–5.20)
RDW: 13.3 % (ref 11.5–14.5)
WBC: 6.8 10*3/uL (ref 3.6–11.0)

## 2016-12-02 LAB — CHLAMYDIA/NGC RT PCR (ARMC ONLY)
CHLAMYDIA TR: NOT DETECTED
N GONORRHOEAE: NOT DETECTED

## 2016-12-02 LAB — LIPASE, BLOOD: Lipase: 28 U/L (ref 11–51)

## 2016-12-02 LAB — PREGNANCY, URINE: PREG TEST UR: NEGATIVE

## 2016-12-02 MED ORDER — ONDANSETRON 4 MG PO TBDP
4.0000 mg | ORAL_TABLET | Freq: Once | ORAL | Status: AC
Start: 2016-12-02 — End: 2016-12-02
  Administered 2016-12-02: 4 mg via ORAL

## 2016-12-02 MED ORDER — METRONIDAZOLE 500 MG PO TABS: 500.0000 mg | ORAL_TABLET | Freq: Two times a day (BID) | ORAL | 0 refills | Status: DC

## 2016-12-02 MED ORDER — HYDROCODONE-ACETAMINOPHEN 5-325 MG PO TABS: 1.0000 | ORAL_TABLET | Freq: Three times a day (TID) | ORAL | 0 refills | Status: DC | PRN

## 2016-12-02 MED ORDER — FLUCONAZOLE 150 MG PO TABS: 150.0000 mg | ORAL_TABLET | Freq: Every day | ORAL | 0 refills | Status: DC

## 2016-12-02 NOTE — Discharge Instructions (Signed)
Take medication as prescribed. Rest. Drink plenty of fluids.   Follow up with your OBGYN this week as scheduled.  Follow up with your primary care physician this week as needed. Return to Urgent care or Emergency room for new or worsening concerns.

## 2016-12-02 NOTE — ED Triage Notes (Signed)
Onset of suprapubic abd pain 3-4 days ago. Pain has progressed to now diffuse pain and N/V. Also low back pain.

## 2016-12-02 NOTE — ED Provider Notes (Signed)
[facility] ____________________________________________  Time seen: Approximately 9:40 AM  I have reviewed the triage vital signs and the nursing notes.   HISTORY  Chief Complaint Abdominal Pain; Nausea; and Emesis   HPI Claudia Snyder is a 31 y.o. female presenting for the complaints of suprapubic abdominal discomfort. Patient reports she has been having this pain for the last 3 days. Patient reports initially the pain would come and go, but reports the pain is been persistent since last night. Patient states the pain has been suprapubic and at times left lower abdomen. Patient reports pain maximum 8 out of 10, currently 3 out of 10. Denies aggravating or alleviating factors. Patient reports she had one episode of vomiting yesterday and some nausea currently. Denies diarrhea. Denies fevers. Denies vaginal discharge, vaginal discomfort, known trigger. Patient reports she is not sexually active. Denies concerns of STDs. Denies concerns of pregnancy. Patient does also report some mild low back aching discomfort. Denies flank pain. Denies pain with range of motion. Patient reports continues to drink fluids well, not eating as much.  Patient reports she has a history of endometriosis including history of dermoid cyst that were removed bilaterally and 2012. Patient reports a that time she was then placed on Depo-Provera and states that she has not had any other reoccurring issues. Patient reports she is still on Depo-Provera and has not missed a dose. Reports her next injection is scheduled for this Friday. Patient reports that she did have a few days this past week of break through bleeding consistent with normal bleeding around time of next Depo-Provera injection. Patient reports that she has an appointment with her OB/GYN in [place] this Friday.  Denies chest pain, shortness of breath, abdominal pain, dysuria, extremity pain, extremity swelling or rash. Denies recent  sickness. Denies recent antibiotic use.   No LMP recorded. Patient has had an injection.    Past Medical History:  Diagnosis Date  . Anemia   . Endometriosis   . Headache(784.0)   . Heart murmur   . PONV (postoperative nausea and vomiting)   . Shortness of breath 2012   Pneumothorax  . Spontaneous pneumothorax     Patient Active Problem List   Diagnosis Date Noted  . Pneumothorax 10/19/2011  . Pneumothorax on right 08/28/2011    Past Surgical History:  Procedure Laterality Date  . HYSTEROSCOPY W/D&C  10/05/2012   Procedure: DILATATION AND CURETTAGE /HYSTEROSCOPY;  Surgeon: Marvene Staff, MD;  Location: [facility];  Service: Gynecology;  Laterality: N/A;  . POLYPECTOMY  10/05/2012   Procedure: POLYPECTOMY;  Surgeon: Marvene Staff, MD;  Location: [facility];  Service: Gynecology;  Laterality: N/A;  . ROBOTIC ASSISTED LAPAROSCOPIC OVARIAN CYSTECTOMY  10/05/2012   Procedure: ROBOTIC ASSISTED LAPAROSCOPIC OVARIAN CYSTECTOMY;  Surgeon: Marvene Staff, MD;  Location: [facility];  Service: Gynecology;  Laterality: Bilateral;  With excision of pelvic endometriosis     No current facility-administered medications for this encounter.   Current Outpatient Prescriptions:  Marland Kitchen  Multiple Vitamin (MULTIVITAMIN) tablet, Take 1 tablet by mouth daily., Disp: , Rfl:  .  calcium-vitamin D (OSCAL WITH D) 500-200 MG-UNIT per tablet, Take 1 tablet by mouth 2 (two) times daily., Disp: , Rfl:  .  ferrous sulfate 325 (65 FE) MG tablet, Take 325 mg by mouth daily with breakfast., Disp: , Rfl:  .  folic acid (FOLVITE) 1 MG tablet, Take 1 mg by mouth daily., Disp: , Rfl:  .  HYDROcodone-acetaminophen (NORCO/VICODIN) 5-325 MG tablet, Take 1 tablet  by mouth every 8 (eight) hours as needed for moderate pain or severe pain (do not drive or operate machinery while taking as can cause drowsiness)., Disp: 6 tablet, Rfl: 0 .  ibuprofen (ADVIL,MOTRIN) 800 MG tablet, Take 1 tablet (800 mg total) by mouth every  8 (eight) hours as needed for pain., Disp: 30 tablet, Rfl: 0 .  ISOtretinoin (ACCUTANE) 40 MG capsule, Take 40 mg by mouth 2 (two) times daily., Disp: , Rfl:  .  medroxyPROGESTERone (DEPO-PROVERA) 150 MG/ML injection, Inject 150 mg into the muscle every 3 (three) months., Disp: , Rfl:  .  meloxicam (MOBIC) 15 MG tablet, Take 1 tablet (15 mg total) by mouth daily., Disp: 14 tablet, Rfl: 0 .  Norethindrone Acet-Ethinyl Est (MICROGESTIN 1.5/30 PO), Take by mouth., Disp: , Rfl:  .  oxyCODONE-acetaminophen (PERCOCET) 5-325 MG per tablet, Take 1 tablet by mouth every 6 (six) hours as needed for pain. For cramps, Disp: 30 tablet, Rfl: 0 .  Prenatal Vit-Fe Fumarate-FA (PRENATAL MULTIVITAMIN) TABS, Take 1 tablet by mouth 2 (two) times daily., Disp: , Rfl:  .  terconazole (TERAZOL 7) 0.4 % vaginal cream, Place 1 applicator vaginally at bedtime., Disp: , Rfl:  .  vitamin B-12 (CYANOCOBALAMIN) 500 MCG tablet, Take 500 mcg by mouth 2 (two) times daily., Disp: , Rfl:   Allergies Other  Family History  Problem Relation Age of Onset  . Hypertension Mother   . Anemia Mother   . Hyperlipidemia Father   . Heart murmur Father   . Asthma Sister   . Cancer Maternal Grandmother     ? type    Social History Social History  Substance Use Topics  . Smoking status: Never Smoker  . Smokeless tobacco: Never Used     Comment: Pt states used to go to the "hookah bars" ans smoke flavored tobacco  . Alcohol use Yes     Comment: occ;1 or 2 x month    Review of Systems Constitutional: No fever/chills Eyes: No visual changes. ENT: No sore throat. Cardiovascular: Denies chest pain. Respiratory: Denies shortness of breath. Gastrointestinal: As above. No diarrhea.  No constipation. Genitourinary: Negative for dysuria. Musculoskeletal: Negative for back pain. Skin: Negative for rash. Neurological: Negative for headaches, focal weakness or numbness.  10-point ROS otherwise  negative.  ____________________________________________   PHYSICAL EXAM:  VITAL SIGNS: ED Triage Vitals  Enc Vitals Group     BP 12/02/16 0909 104/69     Pulse Rate 12/02/16 0909 84     Resp 12/02/16 0909 16     Temp 12/02/16 0909 98.7 F (37.1 C)     Temp Source 12/02/16 0909 Oral     SpO2 12/02/16 0909 100 %     Weight 12/02/16 0911 145 lb (65.8 kg)     Height 12/02/16 0911 5\' 3"  (1.6 m)     Head Circumference --      Peak Flow --      Pain Score 12/02/16 0917 5     Pain Loc --      Pain Edu? --      Excl. in [place]? --     Constitutional: Alert and oriented. Well appearing and in no acute distress. Eyes: Conjunctivae are normal. PERRL. EOMI. ENT      Head: Normocephalic and atraumatic.      Mouth/Throat: Mucous membranes are moist.Oropharynx non-erythematous. Cardiovascular: Normal rate, regular rhythm. Grossly normal heart sounds.  Good peripheral circulation. Respiratory: Normal respiratory effort without tachypnea nor retractions. Breath sounds are clear  and equal bilaterally. No wheezes, rales, rhonchi. Gastrointestinal: Mild midline suprapubic tenderness to palpation. Mild to moderate left suprapubic tenderness to palpation and minimal left lower quadrant abdominal tenderness to palpation. Abdomen otherwise soft and nontender. Non-guarding. No distention. Normal Bowel sounds. No CVA tenderness. Pelvic: Pelvic exam completed with Heather RN at bedside a chaperone. External: Normal appearance, no rash or lesion. Speculum: Mild whitish vaginal discharge, no bleeding, no foreign bodies, cervical is closed. Bimanual: No cervical motion tenderness, no right adnexal tenderness, moderate left adnexal tenderness. Musculoskeletal:  Ambulatory with steady gait. No midline cervical, thoracic or lumbar tenderness to palpation.  Neurologic:  Normal speech and language.  Speech is normal. No gait instability.  Skin:  Skin is warm, dry and intact. No rash noted. Psychiatric: Mood and  affect are normal. Speech and behavior are normal. Patient exhibits appropriate insight and judgment   Radiology  EXAM: TRANSABDOMINAL AND TRANSVAGINAL ULTRASOUND OF PELVIS  DOPPLER ULTRASOUND OF OVARIES  TECHNIQUE: Both transabdominal and transvaginal ultrasound examinations of the pelvis were performed. Transabdominal technique was performed for global imaging of the pelvis including uterus, ovaries, adnexal regions, and pelvic cul-de-sac.  It was necessary to proceed with endovaginal exam following the transabdominal exam to visualize the endometrium and ovaries. Color and duplex Doppler ultrasound was utilized to evaluate blood flow to the ovaries.  COMPARISON:  None.  FINDINGS: Uterus:  Measurements: 4.7 x 3.3 x 3.5 cm. Retroverted. No fibroids or other mass visualized.  Endometrium  Thickness: 5 mm.  No focal abnormality visualized.  Right ovary  Measurements: 4.2 x 2.2 x 2.4 cm. Normal appearance/no adnexal mass.  Left ovary  Measurements: 3.9 x 1.7 x 2.6 cm. Normal appearance/no adnexal mass.  Pulsed Doppler evaluation of both ovaries demonstrates normal low-resistance arterial and venous waveforms.  Other findings  No abnormal free fluid.  IMPRESSION: Normal appearance of uterus and ovaries. No pelvic mass or other significant abnormality identified.  No sonographic evidence for ovarian torsion .   Electronically Signed   By: Earle Gell M.D.   On: 12/02/2016 12:08 ___________________________________________   LABS (all labs ordered are listed, but only abnormal results are displayed)  Labs Reviewed  WET PREP, GENITAL - Abnormal; Notable for the following:       Result Value   Yeast Wet Prep HPF POC PRESENT (*)    Clue Cells Wet Prep HPF POC PRESENT (*)    WBC, Wet Prep HPF POC MODERATE (*)    All other components within normal limits  URINALYSIS, COMPLETE (UACMP) WITH MICROSCOPIC - Abnormal; Notable for the following:     APPearance HAZY (*)    Hgb urine dipstick TRACE (*)    Squamous Epithelial / LPF 0-5 (*)    Bacteria, UA FEW (*)    All other components within normal limits  CBC WITH DIFFERENTIAL/PLATELET - Abnormal; Notable for the following:    MCV 78.8 (*)    MCH 25.7 (*)    All other components within normal limits  BASIC METABOLIC PANEL - Abnormal; Notable for the following:    Potassium 3.4 (*)    Chloride 100 (*)    All other components within normal limits  CHLAMYDIA/NGC RT PCR ([facility] ONLY)  URINE CULTURE  PREGNANCY, URINE  LIPASE, BLOOD     PROCEDURES Procedures    INITIAL IMPRESSION / ASSESSMENT AND PLAN / ED COURSE  Pertinent labs & imaging results that were available during my care of the patient were reviewed by me and considered in my medical decision  making (see chart for details).  Overall well-appearing patient. No acute distress. Presents for the complaints of abdominal pain. Patient with mild to moderate left suprapubic tenderness and mild midline suprapubic tenderness. Pelvic exam completed with moderate left ovarian tenderness. Patient with history of endometriosis as well as dermoid cyst. Discussed with patient we'll evaluate CBC, BMP, urinalysis, urine pregnancy, pelvic ultrasound. 4 mg ODT Zofran given once in urgent care for mild continued nausea.  Labs reviewed with patient. Wet prep showing bacterial vaginosis as well as yeast vaginitis. Urinalysis few bacteria, suspect contaminated sample and will await urine culture. Discussed with patient we'll treat with oral Flagyl as well as oral Diflucan. When necessary Norco, as needed for breakthrough pain quantity 6. Patient to now go to outpatient ultrasound. Discussed in detail with patient as concern of her pain is pain unexplained after ultrasound patient then would go to ER for further evaluation and possible CT scan. Patient agrees to this plan.Discussed indication, risks and benefits of medications with  patient.  [place] controlled substance database reviewed, no controlled substances documented in last 6 months.  Discussed follow up with Primary care physician this week. Discussed follow up and return parameters including no resolution or any worsening concerns. Patient verbalized understanding and agreed to plan.   ____________________________________________   FINAL CLINICAL IMPRESSION(S) / ED DIAGNOSES  Final diagnoses:  Pelvic pain  Bacterial vaginosis  Yeast vaginitis     Discharge Medication List as of 12/02/2016 10:42 AM      Note: This dictation was prepared with Dragon dictation along with smaller phrase technology. Any transcriptional errors that result from this process are unintentional.   Addendum: 1230pm Ultrasound report received from ultrasound tech, per radiologist, patient's pelvic ultrasound normal appearance of uterus and ovaries, new pelvic mass or other significant abnormality, no evidence of ovarian torsion. Discussed these results with patient by telephone. Discussed with patient we'll treat patient for yeast vaginitis as well as bacterial vaginosis, and patient to follow-up closely with her OB/GYN as scheduled this Friday. Discussed in detail with patient for any increased pain or concerns lasting pain procedure to emergency room for further evaluation and possible imaging of abdomen. Patient verbalized understanding of this and agreed to this plan. Patient further states after taking the Zofran in urgent care she is feeling better as well as her pain is improved.      Marylene Land, NP 12/02/16 2044

## 2016-12-02 NOTE — Telephone Encounter (Signed)
Resend medications.

## 2016-12-04 LAB — URINE CULTURE: SPECIAL REQUESTS: NORMAL

## 2017-06-23 ENCOUNTER — Encounter: Payer: Self-pay | Admitting: Emergency Medicine

## 2017-06-23 ENCOUNTER — Ambulatory Visit
Admission: EM | Admit: 2017-06-23 | Discharge: 2017-06-23 | Disposition: A | Discharge status: Home / Self Care | Payer: Federal, State, Local not specified - PPO | Attending: Emergency Medicine | Admitting: Emergency Medicine

## 2017-06-23 DIAGNOSIS — W57XXXA Bitten or stung by nonvenomous insect and other nonvenomous arthropods, initial encounter: Secondary | ICD-10-CM | POA: Diagnosis not present

## 2017-06-23 DIAGNOSIS — L988 Other specified disorders of the skin and subcutaneous tissue: Secondary | ICD-10-CM

## 2017-06-23 MED ORDER — TRIAMCINOLONE ACETONIDE 0.1 % EX CREA: 1.0000 "application " | TOPICAL_CREAM | Freq: Two times a day (BID) | CUTANEOUS | 0 refills | Status: DC

## 2017-06-23 MED ORDER — MUPIROCIN 2 % EX OINT: 1.0000 "application " | TOPICAL_OINTMENT | Freq: Three times a day (TID) | CUTANEOUS | 0 refills | Status: DC

## 2017-06-23 NOTE — Discharge Instructions (Signed)
triamcinolone as needed for itching. Try some Claritin or Zyrtec as well. Bactroban will help any secondary infection. Return here or see your doctor if not getting better, go to the ER for the signs and symptoms we discussed.

## 2017-06-23 NOTE — ED Provider Notes (Signed)
HPI  SUBJECTIVE:  Claudia Snyder is a 31 y.o. female who presents with possible insect bite on the lateral right ankle. She states that she was bitten by something but she is not sure what she was bitten by or when she was bitten by it. She states that she did not feel anything bite her. She just noted itching yesterday. She reports erythema and discoloration in the area. She tried Benadryl cream, soap and water. No aggravating or alleviating factors. No swelling, pain, fevers, bodyaches, lesions elsewhere. No joint aches, abdominal pain. Past medical history negative for MRSA, diabetes, hypertension, HIV, immunocompromise. LMP: Amenorrheic for 1-2 years. She is on the Depo-Provera injection, denies possibility pregnant states he do not need to check. PMD: Dr. Claretta Fraise    Past Medical History:  Diagnosis Date  . Anemia   . Endometriosis   . Headache(784.0)   . Heart murmur   . PONV (postoperative nausea and vomiting)   . Shortness of breath 2012   Pneumothorax  . Spontaneous pneumothorax     Past Surgical History:  Procedure Laterality Date  . HYSTEROSCOPY W/D&C  10/05/2012   Procedure: DILATATION AND CURETTAGE /HYSTEROSCOPY;  Surgeon: Marvene Staff, MD;  Location: [facility];  Service: Gynecology;  Laterality: N/A;  . POLYPECTOMY  10/05/2012   Procedure: POLYPECTOMY;  Surgeon: Marvene Staff, MD;  Location: [facility];  Service: Gynecology;  Laterality: N/A;  . ROBOTIC ASSISTED LAPAROSCOPIC OVARIAN CYSTECTOMY  10/05/2012   Procedure: ROBOTIC ASSISTED LAPAROSCOPIC OVARIAN CYSTECTOMY;  Surgeon: Marvene Staff, MD;  Location: [facility];  Service: Gynecology;  Laterality: Bilateral;  With excision of pelvic endometriosis    Family History  Problem Relation Age of Onset  . Hypertension Mother   . Anemia Mother   . Hyperlipidemia Father   . Heart murmur Father   . Asthma Sister   . Cancer Maternal Grandmother        ? type    Social History  Substance Use  Topics  . Smoking status: Never Smoker  . Smokeless tobacco: Never Used     Comment: Pt states used to go to the "hookah bars" ans smoke flavored tobacco  . Alcohol use Yes     Comment: occ;1 or 2 x month    No current facility-administered medications for this encounter.   Current Outpatient Prescriptions:  .  ibuprofen (ADVIL,MOTRIN) 800 MG tablet, Take 1 tablet (800 mg total) by mouth every 8 (eight) hours as needed for pain., Disp: 30 tablet, Rfl: 0 .  medroxyPROGESTERone (DEPO-PROVERA) 150 MG/ML injection, Inject 150 mg into the muscle every 3 (three) months., Disp: , Rfl:  .  Multiple Vitamin (MULTIVITAMIN) tablet, Take 1 tablet by mouth daily., Disp: , Rfl:  .  mupirocin ointment (BACTROBAN) 2 %, Apply 1 application topically 3 (three) times daily., Disp: 22 g, Rfl: 0 .  triamcinolone cream (KENALOG) 0.1 %, Apply 1 application topically 2 (two) times daily., Disp: 30 g, Rfl: 0  Allergies  Allergen Reactions  . Other Diarrhea and Nausea And Vomiting    Ranch dressing     ROS  As noted in HPI.   Physical Exam  BP 121/66 (BP Location: Left Arm)   Pulse 68   Temp 99.1 F (37.3 C) (Oral)   Resp 16   Ht 5' 3.5" (1.613 m)   Wt 155 lb (70.3 kg)   SpO2 100%   BMI 27.03 kg/m   Constitutional: Well developed, well nourished, no acute distress Eyes:  EOMI, conjunctiva normal  bilaterally HENT: Normocephalic, atraumatic,mucus membranes moist Respiratory: Normal inspiratory effort Cardiovascular: Normal rate GI: nondistended skin: 2 x 1 cm hyperpigmented nontender nonerythematous macule lateral right ankle no crusting, induration, increased temperature, marked area of discoloration with a marker for reference. see picture     Musculoskeletal: no deformities. No ankle swelling. No pain with range of motion of the ankle. Neurologic: Alert & oriented x 3, no focal neuro deficits Psychiatric: Speech and behavior appropriate   ED Course   Medications - No data to  display  No orders of the defined types were placed in this encounter.   No results found for this or any previous visit (from the past 24 hour(s)). No results found.  ED Clinical Impression  Insect bite, initial encounter   ED Assessment/Plan   does not appear to be a black widow or brown recluse bite. We'll have her start Claritin, Zyrtec, triamcinolone for the itching. Bactroban for any possible secondary infection. Follow-up with PMD or here as needed. To the ER if she gets worse. Discussed  MDM, plan and followup with patient. Discussed sn/sx that should prompt return to the ED. Patient agrees with plan.   Meds ordered this encounter  Medications  . triamcinolone cream (KENALOG) 0.1 %    Sig: Apply 1 application topically 2 (two) times daily.    Dispense:  30 g    Refill:  0  . mupirocin ointment (BACTROBAN) 2 %    Sig: Apply 1 application topically 3 (three) times daily.    Dispense:  22 g    Refill:  0    *This clinic note was created using [employer]. Therefore, there may be occasional mistakes despite careful proofreading.  ?   Melynda Ripple, MD 06/23/17 2052

## 2017-06-23 NOTE — ED Triage Notes (Signed)
Patient c/o itchy bump on her right ankle since yesterday.

## 2017-12-19 ENCOUNTER — Encounter: Payer: Self-pay | Admitting: Emergency Medicine

## 2017-12-19 DIAGNOSIS — R05 Cough: Secondary | ICD-10-CM | POA: Diagnosis present

## 2017-12-19 DIAGNOSIS — J111 Influenza due to unidentified influenza virus with other respiratory manifestations: Secondary | ICD-10-CM | POA: Insufficient documentation

## 2017-12-19 LAB — URINALYSIS, COMPLETE (UACMP) WITH MICROSCOPIC
BILIRUBIN URINE: NEGATIVE
Glucose, UA: NEGATIVE mg/dL
HGB URINE DIPSTICK: NEGATIVE
Ketones, ur: NEGATIVE mg/dL
LEUKOCYTES UA: NEGATIVE
NITRITE: NEGATIVE
Protein, ur: NEGATIVE mg/dL
Specific Gravity, Urine: 1.012 (ref 1.005–1.030)
pH: 7 (ref 5.0–8.0)

## 2017-12-19 LAB — COMPREHENSIVE METABOLIC PANEL
ALK PHOS: 44 U/L (ref 38–126)
ALT: 25 U/L (ref 14–54)
ANION GAP: 11 (ref 5–15)
AST: 33 U/L (ref 15–41)
Albumin: 4.3 g/dL (ref 3.5–5.0)
BILIRUBIN TOTAL: 0.5 mg/dL (ref 0.3–1.2)
BUN: 6 mg/dL (ref 6–20)
CALCIUM: 9.4 mg/dL (ref 8.9–10.3)
CO2: 22 mmol/L (ref 22–32)
Chloride: 107 mmol/L (ref 101–111)
Creatinine, Ser: 0.79 mg/dL (ref 0.44–1.00)
GFR calc non Af Amer: >60 mL/min (ref >60)
Glucose, Bld: 90 mg/dL (ref 65–99)
Potassium: 3.6 mmol/L (ref 3.5–5.1)
SODIUM: 140 mmol/L (ref 135–145)
TOTAL PROTEIN: 8 g/dL (ref 6.5–8.1)

## 2017-12-19 LAB — CBC WITH DIFFERENTIAL/PLATELET
BASOS ABS: 0 10*3/uL (ref 0–0.1)
BASOS PCT: 1 %
Eosinophils Absolute: 0.1 10*3/uL (ref 0–0.7)
Eosinophils Relative: 2 %
HEMATOCRIT: 40 % (ref 35.0–47.0)
HEMOGLOBIN: 13.2 g/dL (ref 12.0–16.0)
Lymphocytes Relative: 8 %
Lymphs Abs: 0.6 10*3/uL — ABNORMAL LOW (ref 1.0–3.6)
MCH: 26 pg (ref 26.0–34.0)
MCHC: 32.9 g/dL (ref 32.0–36.0)
MCV: 79 fL — ABNORMAL LOW (ref 80.0–100.0)
Monocytes Absolute: 1 10*3/uL — ABNORMAL HIGH (ref 0.2–0.9)
Monocytes Relative: 15 %
NEUTROS ABS: 5 10*3/uL (ref 1.4–6.5)
NEUTROS PCT: 74 %
Platelets: 246 10*3/uL (ref 150–440)
RBC: 5.06 MIL/uL (ref 3.80–5.20)
RDW: 13.5 % (ref 11.5–14.5)
WBC: 6.8 10*3/uL (ref 3.6–11.0)

## 2017-12-19 LAB — INFLUENZA PANEL BY PCR (TYPE A & B)
Influenza A By PCR: POSITIVE — AB
Influenza B By PCR: NEGATIVE

## 2017-12-19 LAB — POCT PREGNANCY, URINE: PREG TEST UR: NEGATIVE

## 2017-12-19 MED ORDER — ACETAMINOPHEN 325 MG PO TABS
ORAL_TABLET | ORAL | Status: AC
  Filled 2017-12-19: qty 2

## 2017-12-19 MED ORDER — ACETAMINOPHEN 325 MG PO TABS
650.0000 mg | ORAL_TABLET | Freq: Once | ORAL | Status: AC
  Administered 2017-12-19: 650 mg via ORAL

## 2017-12-19 MED ORDER — SODIUM CHLORIDE 0.9 % IV BOLUS (SEPSIS)
1000.0000 mL | Freq: Once | INTRAVENOUS | Status: AC
  Administered 2017-12-19: 1000 mL via INTRAVENOUS

## 2017-12-19 NOTE — ED Triage Notes (Signed)
Pt comes into the ED via POV c/o generalized body aches, sore throat, and cough.  Patient presents tachycardic and febrile.  Patient in NAD with even and unlabored respirations at this time.  Denies any N/V/d and states the symptoms started yesterday.

## 2017-12-20 ENCOUNTER — Emergency Department
Admission: EM | Admit: 2017-12-20 | Discharge: 2017-12-20 | Disposition: A | Discharge status: Home / Self Care | Payer: Federal, State, Local not specified - PPO | Attending: Emergency Medicine | Admitting: Emergency Medicine

## 2017-12-20 DIAGNOSIS — R059 Cough, unspecified: Secondary | ICD-10-CM

## 2017-12-20 DIAGNOSIS — R05 Cough: Secondary | ICD-10-CM

## 2017-12-20 DIAGNOSIS — J111 Influenza due to unidentified influenza virus with other respiratory manifestations: Secondary | ICD-10-CM

## 2017-12-20 MED ORDER — BENZONATATE 100 MG PO CAPS: 100.0000 mg | ORAL_CAPSULE | Freq: Four times a day (QID) | ORAL | 0 refills | Status: DC | PRN

## 2017-12-20 MED ORDER — OSELTAMIVIR PHOSPHATE 75 MG PO CAPS: 75.0000 mg | ORAL_CAPSULE | Freq: Two times a day (BID) | ORAL | 0 refills | Status: AC

## 2017-12-20 MED ORDER — IBUPROFEN 600 MG PO TABS
600.0000 mg | ORAL_TABLET | Freq: Once | ORAL | Status: AC
  Administered 2017-12-20: 600 mg via ORAL
  Filled 2017-12-20: qty 1

## 2017-12-20 MED ORDER — OSELTAMIVIR PHOSPHATE 75 MG PO CAPS
75.0000 mg | ORAL_CAPSULE | Freq: Once | ORAL | Status: AC
  Administered 2017-12-20: 75 mg via ORAL
  Filled 2017-12-20: qty 1

## 2017-12-20 MED ORDER — BENZONATATE 200 MG PO CAPS: 200.0000 mg | ORAL_CAPSULE | Freq: Three times a day (TID) | ORAL | 0 refills | Status: DC | PRN

## 2017-12-20 NOTE — ED Notes (Signed)
Pt reports flulike sx since last night.  Pt has a cough, runny nose, fever and bodyaches.  No v/d.  Pt alert.

## 2017-12-20 NOTE — Discharge Instructions (Signed)
Please follow up with the acute care clinic. Please rest and drink lots of fluids. Please treat fever and body aches with tylenol and ibuprofen. Please return with any worsened condition, SOB or other concerns

## 2017-12-20 NOTE — ED Provider Notes (Signed)
[facility] Emergency Department Provider Note   ____________________________________________   First MD Initiated Contact with Patient 12/20/17 0157     (approximate)  I have reviewed the triage vital signs and the nursing notes.   HISTORY  Chief Complaint Influenza    HPI Claudia Snyder is a 32 y.o. female who comes into the hospital today with some flu symptoms.  The patient states that she has had a temperature to 103 with some cough and sore throat as well as body aches.  The patient states that a friend who is a nurse told her to come into the emergency room for evaluation.  The patient's symptoms started last night.  She reports that she has been taking Alka-Seltzer cold and flu but she denies any sick contacts.  The patient came into the hospital today for evaluation.  She denies any shortness of breath any dizziness or lightheadedness.  She has had no vomiting or nausea   Past Medical History:  Diagnosis Date  . Anemia   . Endometriosis   . Headache(784.0)   . Heart murmur   . PONV (postoperative nausea and vomiting)   . Shortness of breath 2012   Pneumothorax  . Spontaneous pneumothorax     Patient Active Problem List   Diagnosis Date Noted  . Pneumothorax 10/19/2011  . Pneumothorax on right 08/28/2011    Past Surgical History:  Procedure Laterality Date  . HYSTEROSCOPY W/D&C  10/05/2012   Procedure: DILATATION AND CURETTAGE /HYSTEROSCOPY;  Surgeon: Marvene Staff, MD;  Location: [facility];  Service: Gynecology;  Laterality: N/A;  . POLYPECTOMY  10/05/2012   Procedure: POLYPECTOMY;  Surgeon: Marvene Staff, MD;  Location: [facility];  Service: Gynecology;  Laterality: N/A;  . ROBOTIC ASSISTED LAPAROSCOPIC OVARIAN CYSTECTOMY  10/05/2012   Procedure: ROBOTIC ASSISTED LAPAROSCOPIC OVARIAN CYSTECTOMY;  Surgeon: Marvene Staff, MD;  Location: [facility];  Service: Gynecology;  Laterality: Bilateral;  With excision of pelvic  endometriosis    Prior to Admission medications   Medication Sig Start Date End Date Taking? Authorizing Provider  medroxyPROGESTERone (DEPO-PROVERA) 150 MG/ML injection Inject 150 mg into the muscle every 3 (three) months.   Yes [provider]  Multiple Vitamin (MULTIVITAMIN) tablet Take 1 tablet by mouth daily.   Yes [provider]  benzonatate (TESSALON PERLES) 100 MG capsule Take 1 capsule (100 mg total) by mouth every 6 (six) hours as needed for cough. 12/20/17   Loney Hering, MD  oseltamivir (TAMIFLU) 75 MG capsule Take 1 capsule (75 mg total) by mouth 2 (two) times daily for 5 days. 12/20/17 12/25/17  Loney Hering, MD    Allergies Other  Family History  Problem Relation Age of Onset  . Hypertension Mother   . Anemia Mother   . Hyperlipidemia Father   . Heart murmur Father   . Asthma Sister   . Cancer Maternal Grandmother        ? type    Social History Social History   Tobacco Use  . Smoking status: Never Smoker  . Smokeless tobacco: Never Used  . Tobacco comment: Pt states used to go to the "hookah bars" ans smoke flavored tobacco  Substance Use Topics  . Alcohol use: Yes    Comment: occ;1 or 2 x month  . Drug use: No    Review of Systems  Constitutional:  fever/chills Eyes: No visual changes. ENT:  sore throat. Cardiovascular: Denies chest pain. Respiratory: cough Gastrointestinal: No abdominal pain.  No  nausea, no vomiting.  No diarrhea.  No constipation. Genitourinary: Negative for dysuria. Musculoskeletal: body aches Skin: Negative for rash. Neurological: Negative for headaches, focal weakness or numbness.   ____________________________________________   PHYSICAL EXAM:  VITAL SIGNS: ED Triage Vitals  Enc Vitals Group     BP 12/19/17 2129 134/78     Pulse Rate 12/19/17 2129 (!) 135     Resp 12/19/17 2129 20     Temp 12/19/17 2129 (!) 103.2 F (39.6 C)     Temp Source 12/19/17 2129 Oral     SpO2 12/19/17 2129 100  %     Weight 12/19/17 2129 150 lb (68 kg)     Height 12/19/17 2129 5\' 3"  (1.6 m)     Head Circumference --      Peak Flow --      Pain Score 12/19/17 2131 6     Pain Loc --      Pain Edu? --      Excl. in [place]? --     Constitutional: Alert and oriented. Well appearing and in mild distress. Eyes: Conjunctivae are normal. PERRL. EOMI. Head: Atraumatic. Nose: No congestion/rhinnorhea. Mouth/Throat: Mucous membranes are moist.  Oropharynx non-erythematous. Cardiovascular: Normal rate, regular rhythm. Grossly normal heart sounds.  Good peripheral circulation. Respiratory: Normal respiratory effort.  No retractions. Lungs CTAB. Gastrointestinal: Soft and nontender. No distention. Positive bowel sounds Musculoskeletal: No lower extremity tenderness nor edema.  Neurologic:  Normal speech and language.  Skin:  Skin is warm, dry and intact.  Psychiatric: Mood and affect are normal.   ____________________________________________   LABS (all labs ordered are listed, but only abnormal results are displayed)  Labs Reviewed  CBC WITH DIFFERENTIAL/PLATELET - Abnormal; Notable for the following components:      Result Value   MCV 79.0 (*)    Lymphs Abs 0.6 (*)    Monocytes Absolute 1.0 (*)    All other components within normal limits  URINALYSIS, COMPLETE (UACMP) WITH MICROSCOPIC - Abnormal; Notable for the following components:   Color, Urine YELLOW (*)    APPearance CLEAR (*)    Bacteria, UA RARE (*)    Squamous Epithelial / LPF 0-5 (*)    All other components within normal limits  INFLUENZA PANEL BY PCR (TYPE A & B) - Abnormal; Notable for the following components:   Influenza A By PCR POSITIVE (*)    All other components within normal limits  COMPREHENSIVE METABOLIC PANEL  POC URINE PREG, ED  POCT PREGNANCY, URINE   ____________________________________________  EKG  none ____________________________________________  RADIOLOGY  ED MD interpretation:  none  Official  radiology report(s): No results found.  ____________________________________________   PROCEDURES  Procedure(s) performed: None  Procedures  Critical Care performed: No  ____________________________________________   INITIAL IMPRESSION / ASSESSMENT AND PLAN / ED COURSE  As part of my medical decision making, I reviewed the following data within the electronic MEDICAL RECORD NUMBER Notes from prior ED visits and [place] Controlled Substance Database   This is a 32 year old female who comes into the hospital today with fever cough body aches and a concern for influenza.  My differential diagnosis includes influenza, viral upper respiratory infection  The patient was febrile with some tachycardia when she arrived.  The patient received some Tylenol as well as some blood work.  The patient had a CBC CMP urinalysis drawn which were all on.  We did check an influenza and the patient's flu was positive.  Her heart rate and temperature improved.  I did give the patient some ibuprofen as well and some Tamiflu.  She feels improved and is ready to eat.  She will be discharged home to follow-up with her primary care physician.      ____________________________________________   FINAL CLINICAL IMPRESSION(S) / ED DIAGNOSES  Final diagnoses:  Influenza  Cough     ED Discharge Orders        Ordered    oseltamivir (TAMIFLU) 75 MG capsule  2 times daily     12/20/17 0205    benzonatate (TESSALON PERLES) 100 MG capsule  Every 6 hours PRN     12/20/17 0205       Note:  This document was prepared using Dragon voice recognition software and may include unintentional dictation errors.    Loney Hering, MD 12/20/17 [phone]

## 2018-02-15 IMAGING — US US TRANSVAGINAL NON-OB
1 series · 13 of 25 positions shown · non-contrast
Comparison: None.

CLINICAL DATA: Left lower quadrant and pelvic pain for 4 days.
Endometriosis. Previous history of ovarian dermoid. Clinical
suspicion for ovarian torsion.

EXAM:
TRANSABDOMINAL AND TRANSVAGINAL ULTRASOUND OF PELVIS
DOPPLER ULTRASOUND OF OVARIES
TECHNIQUE: Both transabdominal and transvaginal ultrasound examinations of the
pelvis were performed. Transabdominal technique was performed for
global imaging of the pelvis including uterus, ovaries, adnexal
regions, and pelvic cul-de-sac.
It was necessary to proceed with endovaginal exam following the
transabdominal exam to visualize the endometrium and ovaries. Color
and duplex Doppler ultrasound was utilized to evaluate blood flow to
the ovaries.

[Series 1: us transvaginal non-ob · 0.20mm/px · 13 of 76 slices shown]
[im 1/76]
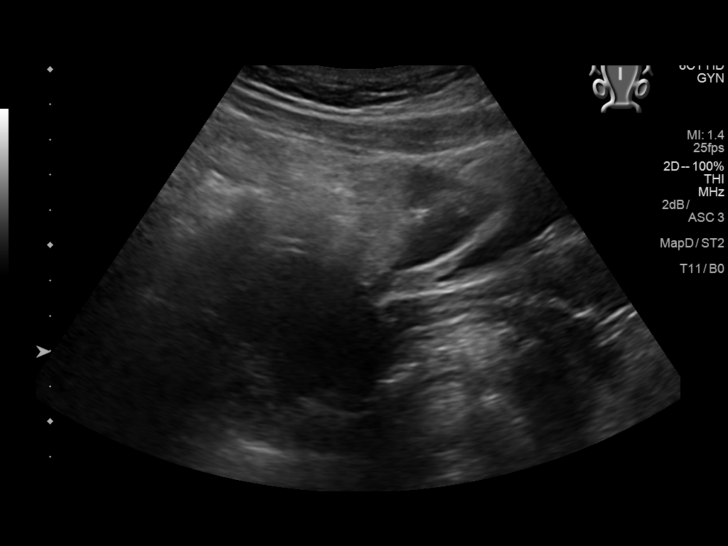
[im 7/76]
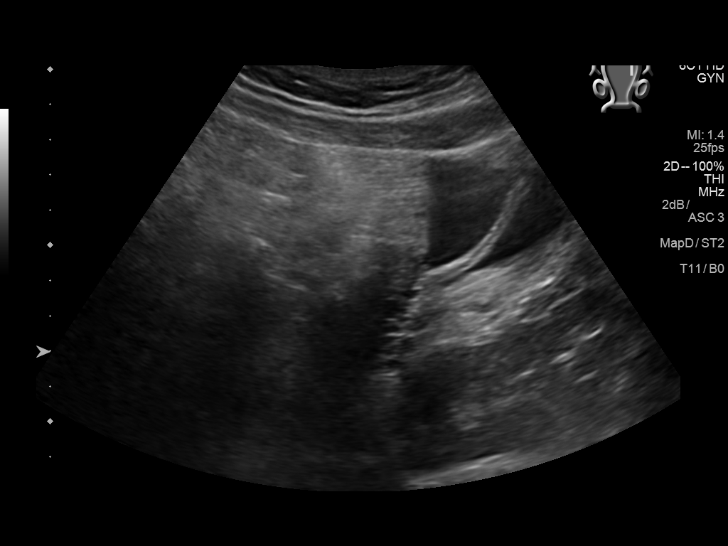
[im 13/76]
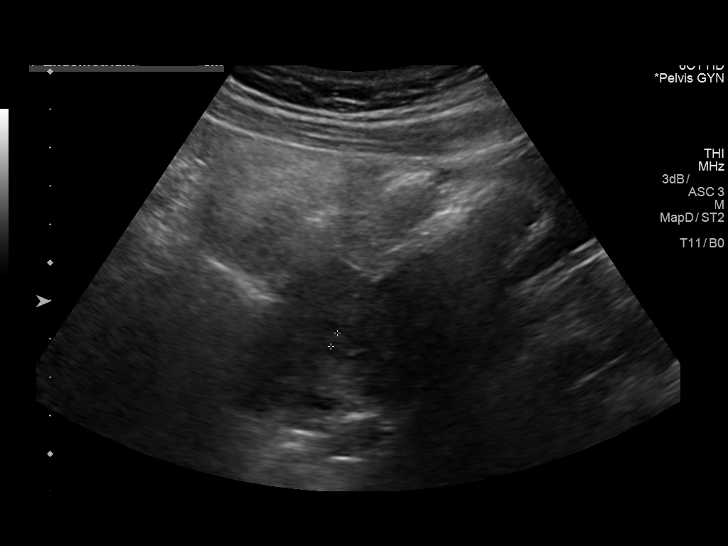
[im 19/76]
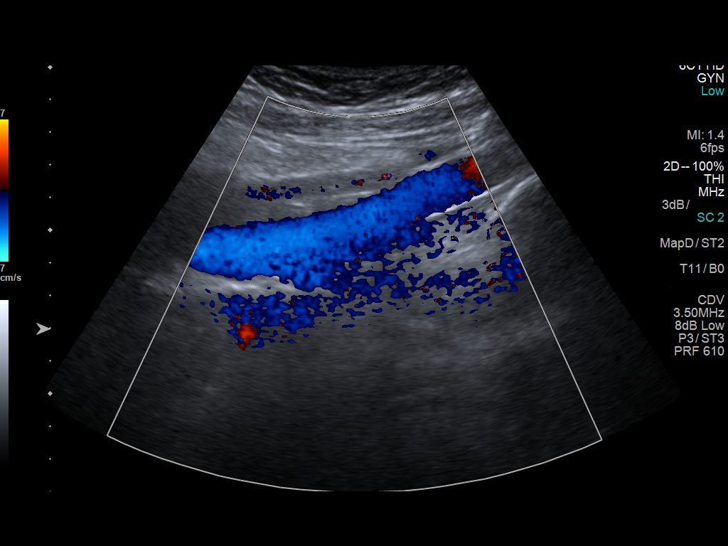
[im 26/76]
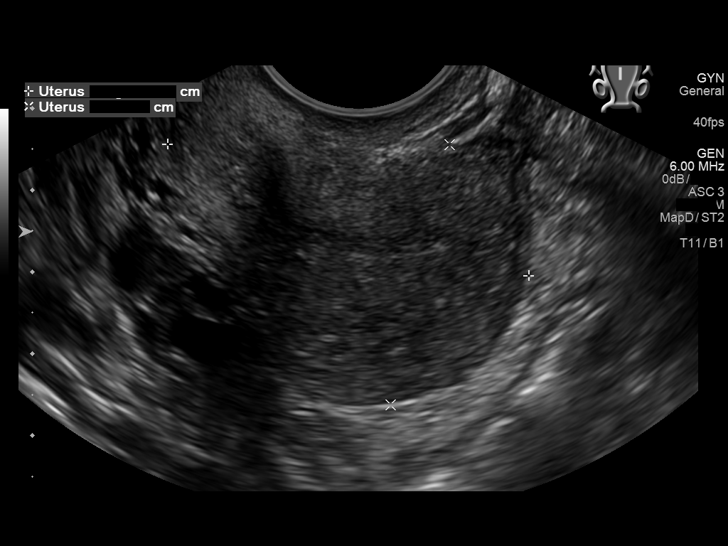
[im 32/76]
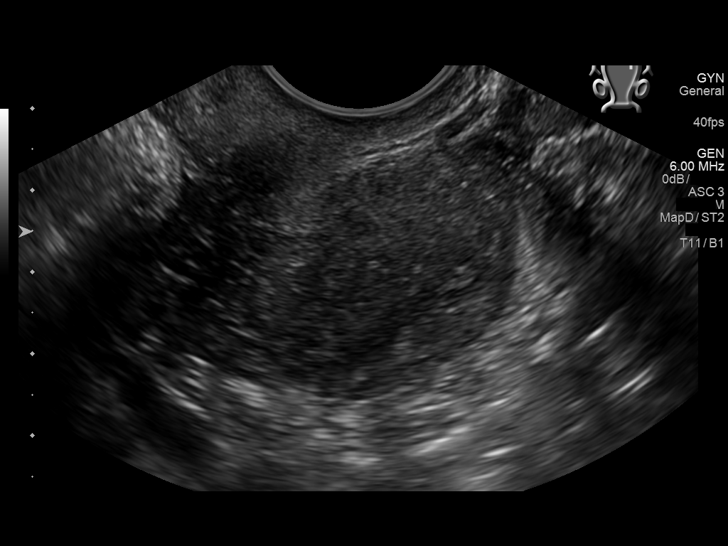
[im 38/76]
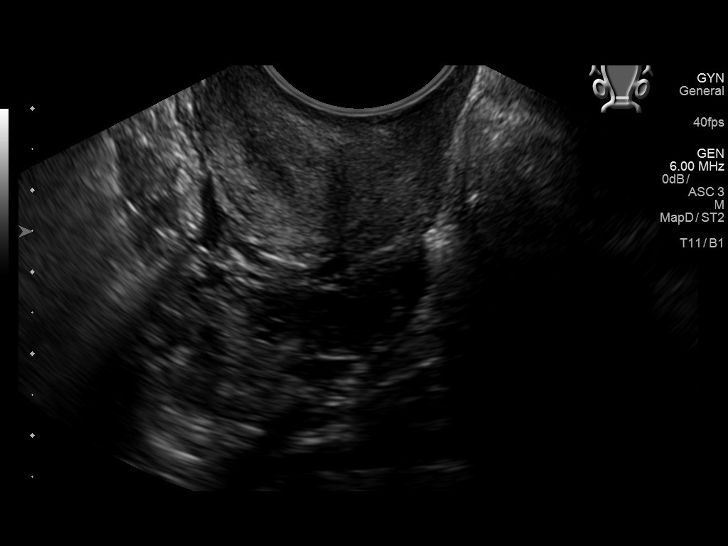
[im 44/76]
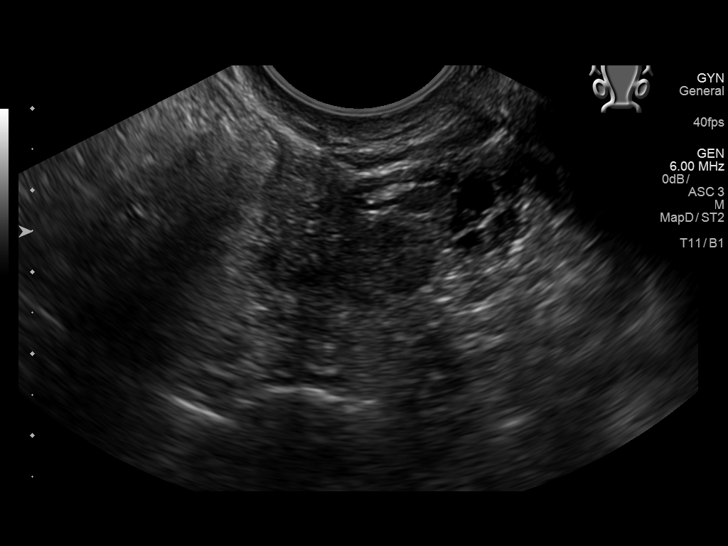
[im 51/76]
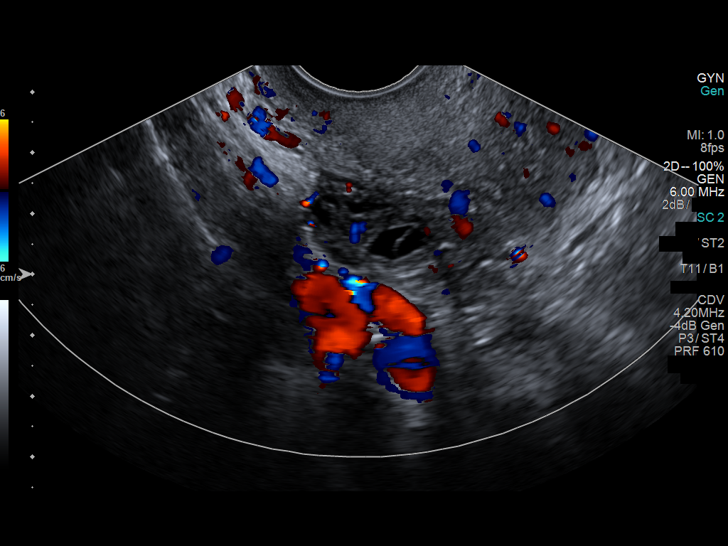
[im 57/76]
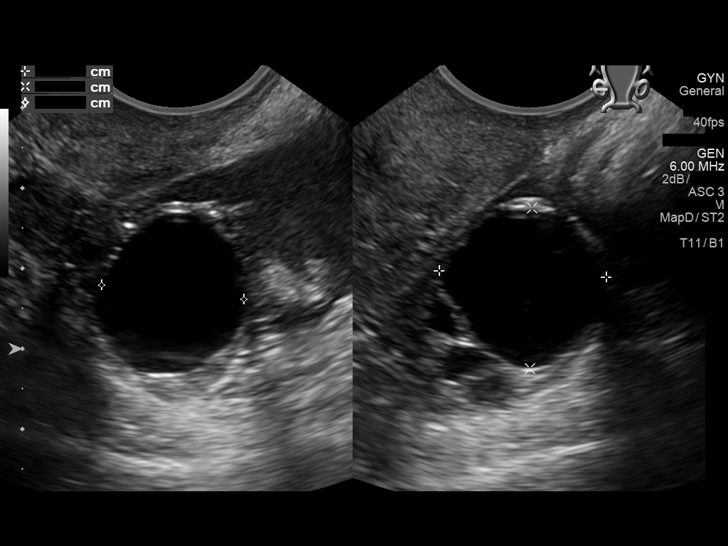
[im 63/76]
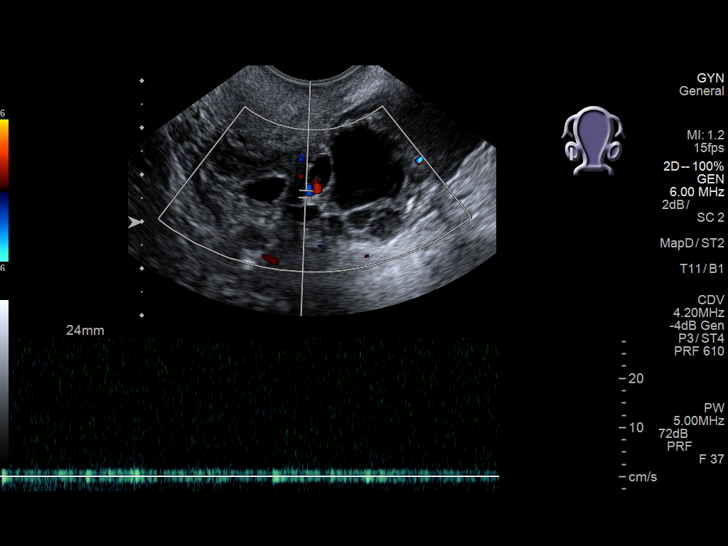
[im 69/76]
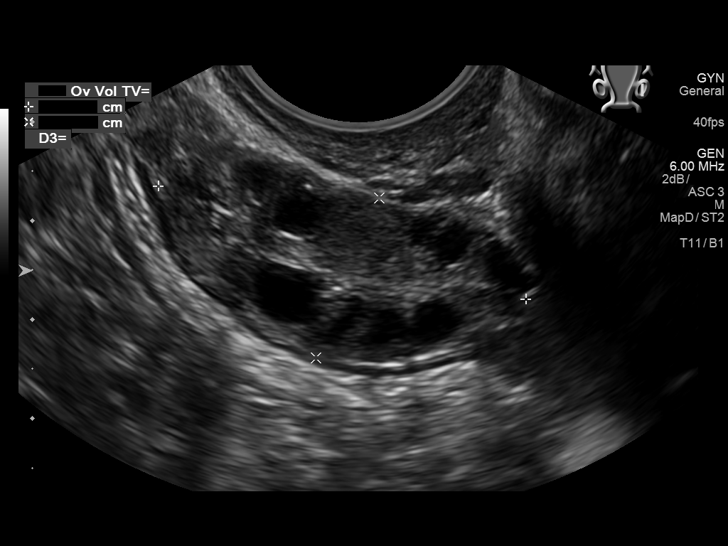
[im 76/76]
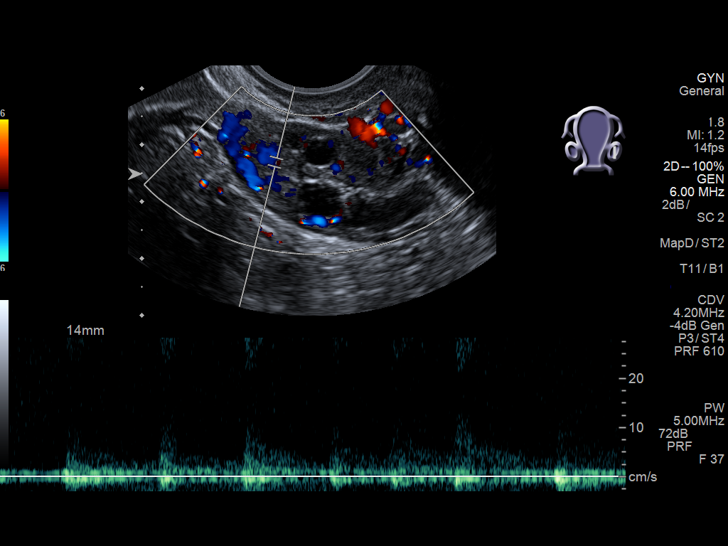

[13 of 25 positions shown; findings below may reference images not displayed]

FINDINGS: Uterus:

Measurements: 4.7 x 3.3 x 3.5 cm. Retroverted. No fibroids or other
mass visualized.

Endometrium

Thickness: 5 mm.  No focal abnormality visualized.

Right ovary

Measurements: 4.2 x 2.2 x 2.4 cm. Normal appearance/no adnexal mass.

Left ovary

Measurements: 3.9 x 1.7 x 2.6 cm. Normal appearance/no adnexal mass.

Pulsed Doppler evaluation of both ovaries demonstrates normal
low-resistance arterial and venous waveforms.

Other findings

No abnormal free fluid.
IMPRESSION: Normal appearance of uterus and ovaries. No pelvic mass or other
significant abnormality identified.

No sonographic evidence for ovarian torsion .

## 2021-02-10 ENCOUNTER — Other Ambulatory Visit: Payer: Self-pay | Admitting: Specialist

## 2021-02-10 DIAGNOSIS — J939 Pneumothorax, unspecified: Secondary | ICD-10-CM

## 2023-10-20 ENCOUNTER — Ambulatory Visit
Admission: EM | Admit: 2023-10-20 | Discharge: 2023-10-20 | Disposition: A | Discharge status: Home / Self Care | Payer: Commercial Managed Care - PPO

## 2023-10-20 DIAGNOSIS — H1031 Unspecified acute conjunctivitis, right eye: Secondary | ICD-10-CM

## 2023-10-20 DIAGNOSIS — J069 Acute upper respiratory infection, unspecified: Secondary | ICD-10-CM

## 2023-10-20 DIAGNOSIS — J0100 Acute maxillary sinusitis, unspecified: Secondary | ICD-10-CM

## 2023-10-20 MED ORDER — BENZONATATE 100 MG PO CAPS: 100.0000 mg | ORAL_CAPSULE | Freq: Three times a day (TID) | ORAL | 0 refills | Status: AC | PRN

## 2023-10-20 MED ORDER — POLYMYXIN B-TRIMETHOPRIM 10000-0.1 UNIT/ML-% OP SOLN: 1.0000 [drp] | Freq: Four times a day (QID) | OPHTHALMIC | 0 refills | Status: AC

## 2023-10-20 MED ORDER — AZITHROMYCIN 250 MG PO TABS: 250.0000 mg | ORAL_TABLET | Freq: Every day | ORAL | 0 refills | Status: AC

## 2023-10-20 NOTE — Discharge Instructions (Addendum)
Take the Zithromax and use the eye drops as directed.  Follow up with your primary care provider if your symptoms are not improving.

## 2023-10-20 NOTE — ED Provider Notes (Signed)
Claudia Snyder    CSN: [id number] Arrival date & time: 10/20/23  0865      History   Chief Complaint Chief Complaint  Patient presents with   URI    HPI Claudia Snyder is a 37 y.o. female.  Patient presents with 2-week history of runny nose and cough.  No fever, chills, shortness of breath.  Treating with humidifier.  She also reports right eye redness, itching, crusting in lashes this morning.  No eye trauma, change in vision, drainage.  Patient's medical history includes catamenial pneumothorax x 2.  The history is provided by the patient and medical records.    Past Medical History:  Diagnosis Date   Anemia    Endometriosis    Headache(784.0)    Heart murmur    PONV (postoperative nausea and vomiting)    Shortness of breath 2012   Pneumothorax   Spontaneous pneumothorax     Patient Active Problem List   Diagnosis Date Noted   Pneumothorax 10/19/2011   Pneumothorax on right 08/28/2011    Past Surgical History:  Procedure Laterality Date   HYSTEROSCOPY WITH D & C  10/05/2012   Procedure: DILATATION AND CURETTAGE /HYSTEROSCOPY;  Surgeon: Serita Kyle, MD;  Location: [facility];  Service: Gynecology;  Laterality: N/A;   POLYPECTOMY  10/05/2012   Procedure: POLYPECTOMY;  Surgeon: Serita Kyle, MD;  Location: [facility];  Service: Gynecology;  Laterality: N/A;   ROBOTIC ASSISTED LAPAROSCOPIC OVARIAN CYSTECTOMY  10/05/2012   Procedure: ROBOTIC ASSISTED LAPAROSCOPIC OVARIAN CYSTECTOMY;  Surgeon: Serita Kyle, MD;  Location: [facility];  Service: Gynecology;  Laterality: Bilateral;  With excision of pelvic endometriosis    OB History   No obstetric history on file.      Home Medications    Prior to Admission medications   Medication Sig Start Date End Date Taking? Authorizing Provider  azithromycin (ZITHROMAX) 250 MG tablet Take 1 tablet (250 mg total) by mouth daily. Take first 2 tablets together, then 1 every day until finished.  10/20/23  Yes Mickie Bail, NP  benzonatate (TESSALON) 100 MG capsule Take 1 capsule (100 mg total) by mouth 3 (three) times daily as needed for cough. 10/20/23  Yes Mickie Bail, NP  trimethoprim-polymyxin b (POLYTRIM) ophthalmic solution Place 1 drop into both eyes 4 (four) times daily for 7 days. 10/20/23 10/27/23 Yes Mickie Bail, NP  medroxyPROGESTERone (DEPO-PROVERA) 150 MG/ML injection Inject 150 mg into the muscle every 3 (three) months.    [provider]  Multiple Vitamin (MULTIVITAMIN) tablet Take 1 tablet by mouth daily.    [provider]  Vitamin D, Ergocalciferol, (DRISDOL) 1.25 MG (50000 UNIT) CAPS capsule Take 50,000 Units by mouth once a week.    [provider]    Family History Family History  Problem Relation Age of Onset   Hypertension Mother    Anemia Mother    Hyperlipidemia Father    Heart murmur Father    Asthma Sister    Cancer Maternal Grandmother        ? type    Social History Social History   Tobacco Use   Smoking status: Never   Smokeless tobacco: Never   Tobacco comments:    Pt states used to go to the "hookah bars" ans smoke flavored tobacco  Vaping Use   Vaping status: Never Used  Substance Use Topics   Alcohol use: Yes    Comment: occ;1 or 2 x month   Drug use: No  Allergies   Other   Review of Systems Review of Systems  Constitutional:  Negative for chills and fever.  HENT:  Positive for congestion, postnasal drip, rhinorrhea, sinus pressure and sore throat. Negative for ear pain.   Eyes:  Positive for discharge and redness. Negative for pain and visual disturbance.  Respiratory:  Positive for cough. Negative for shortness of breath.   Cardiovascular:  Negative for chest pain and palpitations.     Physical Exam Triage Vital Signs ED Triage Vitals [10/20/23 0925]  Encounter Vitals Group     BP 108/75     Systolic BP Percentile      Diastolic BP Percentile      Pulse Rate 83     Resp 18      Temp 98.7 F (37.1 C)     Temp src      SpO2 98 %     Weight      Height      Head Circumference      Peak Flow      Pain Score      Pain Loc      Pain Education      Exclude from Growth Chart    No data found.  Updated Vital Signs BP 108/75   Pulse 83   Temp 98.7 F (37.1 C)   Resp 18   SpO2 98%   Visual Acuity Right Eye Distance: 20/80 Left Eye Distance: 20/80 Bilateral Distance: 20/80 (WEARS GLASSES, TESTED UNCORRECTED)  Right Eye Near:   Left Eye Near:    Bilateral Near:     Physical Exam Constitutional:      General: She is not in acute distress. HENT:     Right Ear: Tympanic membrane normal.     Left Ear: Tympanic membrane normal.     Nose: Congestion present.     Mouth/Throat:     Mouth: Mucous membranes are moist.     Pharynx: Oropharynx is clear.  Eyes:     General: Lids are normal.        Right eye: No discharge.        Left eye: No discharge.     Extraocular Movements: Extraocular movements intact.     Conjunctiva/sclera:     Right eye: Right conjunctiva is injected.     Pupils: Pupils are equal, round, and reactive to light.  Cardiovascular:     Rate and Rhythm: Normal rate and regular rhythm.     Heart sounds: Normal heart sounds.  Pulmonary:     Effort: Pulmonary effort is normal. No respiratory distress.     Breath sounds: Normal breath sounds.  Neurological:     Mental Status: She is alert.      UC Treatments / Results  Labs (all labs ordered are listed, but only abnormal results are displayed) Labs Reviewed - No data to display  EKG   Radiology No results found.  Procedures Procedures (including critical care time)  Medications Ordered in UC Medications - No data to display  Initial Impression / Assessment and Plan / UC Course  I have reviewed the triage vital signs and the nursing notes.  Pertinent labs & imaging results that were available during my care of the patient were reviewed by me and considered in my medical  decision making (see chart for details).    Acute upper respiratory infection, acute sinusitis, bacterial conjunctivitis of right eye.  Afebrile and vital signs are stable.  No chest pain or shortness of breath.  O2 sat 98% on room air.  Treating today with Zithromax, Tessalon Perles, Polytrim eyedrops.  Education provided on URI and conjunctivitis.  Instructed patient to follow-up with her PCP if she is not improving.  She agrees to plan of care.  Final Clinical Impressions(s) / UC Diagnoses   Final diagnoses:  Acute upper respiratory infection  Acute non-recurrent maxillary sinusitis  Acute bacterial conjunctivitis of right eye     Discharge Instructions      Take the Zithromax and use the eye drops as directed.  Follow-up with your primary care provider if your symptoms are not improving.      ED Prescriptions     Medication Sig Dispense Auth. Provider   azithromycin (ZITHROMAX) 250 MG tablet Take 1 tablet (250 mg total) by mouth daily. Take first 2 tablets together, then 1 every day until finished. 6 tablet Mickie Bail, NP   trimethoprim-polymyxin b (POLYTRIM) ophthalmic solution Place 1 drop into both eyes 4 (four) times daily for 7 days. 10 mL Mickie Bail, NP   benzonatate (TESSALON) 100 MG capsule Take 1 capsule (100 mg total) by mouth 3 (three) times daily as needed for cough. 21 capsule Mickie Bail, NP      PDMP not reviewed this encounter.   Mickie Bail, NP 10/20/23 [phone]

## 2023-10-20 NOTE — ED Triage Notes (Addendum)
Patient to Urgent Care with complaints of cough/ runny nose. Symptoms started 2 weeks ago. Denies any fevers- max temp 99.5.   Meds: no otc. Using humidifier.  Also reports waking up with right sided eye itching/ redness/ crusting.
